# Patient Record
Sex: Female | Born: 1990 | Race: White | Hispanic: No | Marital: Single | Smoking: Current every day smoker
Health system: Southern US, Community
[De-identification: ages and names within clinical notes are randomized; demographics above are authoritative.]

## PROBLEM LIST (undated history)

## (undated) HISTORY — PX: CHOLECYSTECTOMY: SHX55

---

## 2009-07-18 ENCOUNTER — Emergency Department (HOSPITAL_COMMUNITY): Admission: EM | Admit: 2009-07-18 | Discharge: 2009-07-18 | Payer: Self-pay | Admitting: Emergency Medicine

## 2009-07-20 ENCOUNTER — Inpatient Hospital Stay (HOSPITAL_COMMUNITY): Admission: EM | Admit: 2009-07-20 | Discharge: 2009-07-21 | Payer: Self-pay | Admitting: Emergency Medicine

## 2009-07-20 ENCOUNTER — Encounter (INDEPENDENT_AMBULATORY_CARE_PROVIDER_SITE_OTHER): Payer: Self-pay

## 2009-10-25 ENCOUNTER — Encounter: Admission: RE | Admit: 2009-10-25 | Discharge: 2009-10-25 | Payer: Self-pay | Admitting: Specialist

## 2010-06-28 LAB — URINALYSIS, ROUTINE W REFLEX MICROSCOPIC
Bilirubin Urine: NEGATIVE
Glucose, UA: NEGATIVE mg/dL
Glucose, UA: NEGATIVE mg/dL
Hgb urine dipstick: NEGATIVE
Hgb urine dipstick: NEGATIVE
Ketones, ur: 40 mg/dL — AB
Ketones, ur: NEGATIVE mg/dL
Nitrite: NEGATIVE
Protein, ur: NEGATIVE mg/dL
Urobilinogen, UA: 1 mg/dL (ref 0.0–1.0)
pH: 6.5 (ref 5.0–8.0)

## 2010-06-28 LAB — COMPREHENSIVE METABOLIC PANEL
AST: 17 U/L (ref 0–37)
Alkaline Phosphatase: 54 U/L (ref 39–117)
BUN: 9 mg/dL (ref 6–23)
Calcium: 9.1 mg/dL (ref 8.4–10.5)
Chloride: 102 mEq/L (ref 96–112)
GFR calc Af Amer: >60 mL/min (ref >60)
GFR calc Af Amer: >60 mL/min (ref >60)
GFR calc non Af Amer: >60 mL/min (ref >60)
GFR calc non Af Amer: >60 mL/min (ref >60)
Glucose, Bld: 114 mg/dL — ABNORMAL HIGH (ref 70–99)
Glucose, Bld: 126 mg/dL — ABNORMAL HIGH (ref 70–99)
Sodium: 138 mEq/L (ref 135–145)
Total Bilirubin: 0.6 mg/dL (ref 0.3–1.2)
Total Bilirubin: 0.9 mg/dL (ref 0.3–1.2)
Total Protein: 6.5 g/dL (ref 6.0–8.3)

## 2010-06-28 LAB — DIFFERENTIAL
Basophils Absolute: 0 10*3/uL (ref 0.0–0.1)
Basophils Absolute: 0.1 10*3/uL (ref 0.0–0.1)
Eosinophils Absolute: 0.1 10*3/uL (ref 0.0–0.7)
Eosinophils Relative: 1 % (ref 0–5)
Lymphocytes Relative: 12 % (ref 12–46)
Lymphs Abs: 1.2 10*3/uL (ref 0.7–4.0)
Lymphs Abs: 1.4 10*3/uL (ref 0.7–4.0)
Monocytes Absolute: 0.4 10*3/uL (ref 0.1–1.0)
Monocytes Absolute: 0.5 10*3/uL (ref 0.1–1.0)
Monocytes Relative: 4 % (ref 3–12)
Neutro Abs: 7.9 10*3/uL — ABNORMAL HIGH (ref 1.7–7.7)
Neutrophils Relative %: 79 % — ABNORMAL HIGH (ref 43–77)

## 2010-06-28 LAB — CBC
HCT: 38.4 % (ref 36.0–46.0)
HCT: 41.4 % (ref 36.0–46.0)
Hemoglobin: 14.6 g/dL (ref 12.0–15.0)
MCV: 95.8 fL (ref 78.0–100.0)
Platelets: 152 10*3/uL (ref 150–400)
Platelets: 172 10*3/uL (ref 150–400)
RDW: 11.6 % (ref 11.5–15.5)
WBC: 9.1 10*3/uL (ref 4.0–10.5)
WBC: 9.6 10*3/uL (ref 4.0–10.5)

## 2010-06-28 LAB — URINE MICROSCOPIC-ADD ON

## 2010-06-28 LAB — POCT PREGNANCY, URINE: Preg Test, Ur: NEGATIVE

## 2011-05-28 IMAGING — US US ABDOMEN COMPLETE
1 series · 13 of 25 positions shown · non-contrast
Comparison: CT abdomen earlier today.

CLINICAL DATA: Abdominal pain.  Gallbladder wall thickening and
periportal edema on CT earlier.

COMPLETE ABDOMINAL ULTRASOUND 07/18/2009:

[Series 1: us abdomen complete · 0.31mm/px · 13 of 65 slices shown]
[im 1/65]
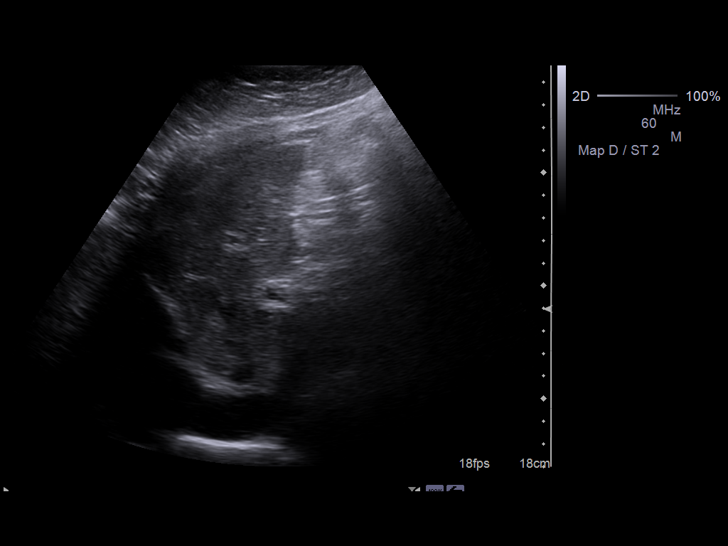
[im 6/65]
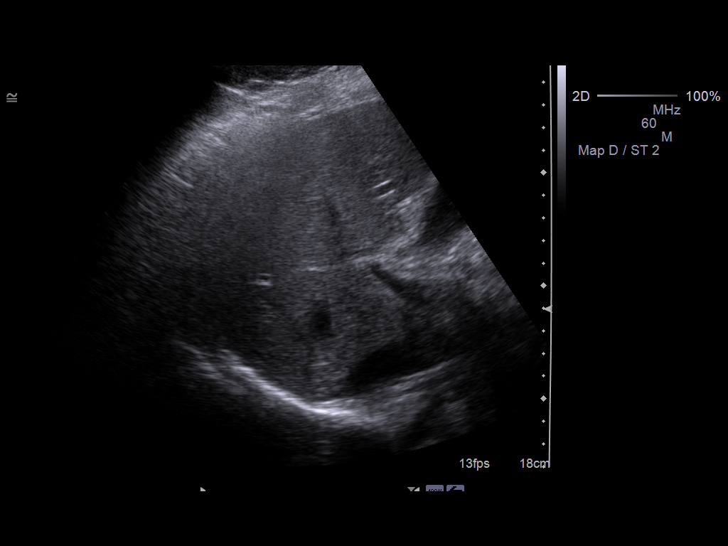
[im 11/65]
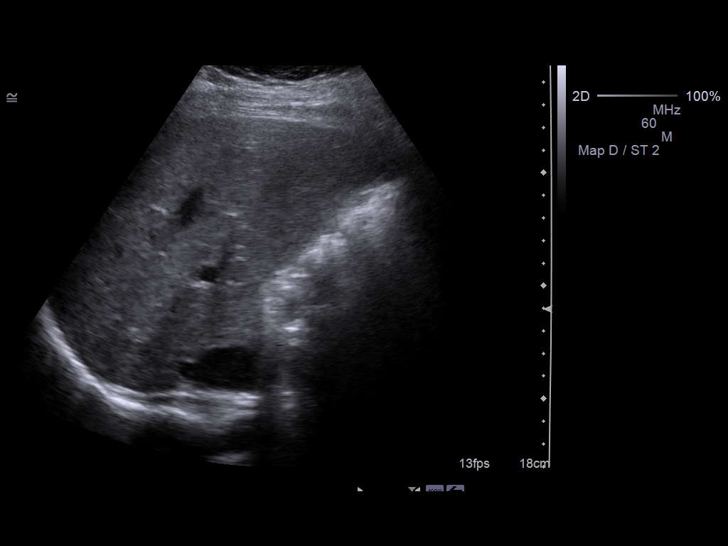
[im 17/65]
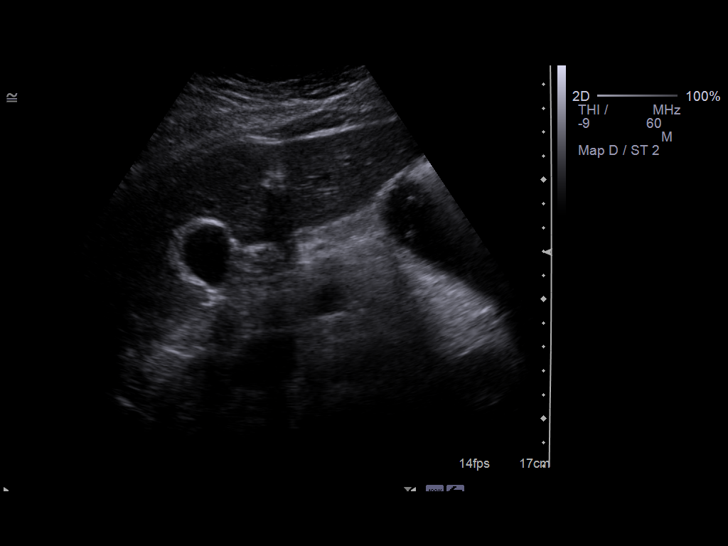
[im 22/65]
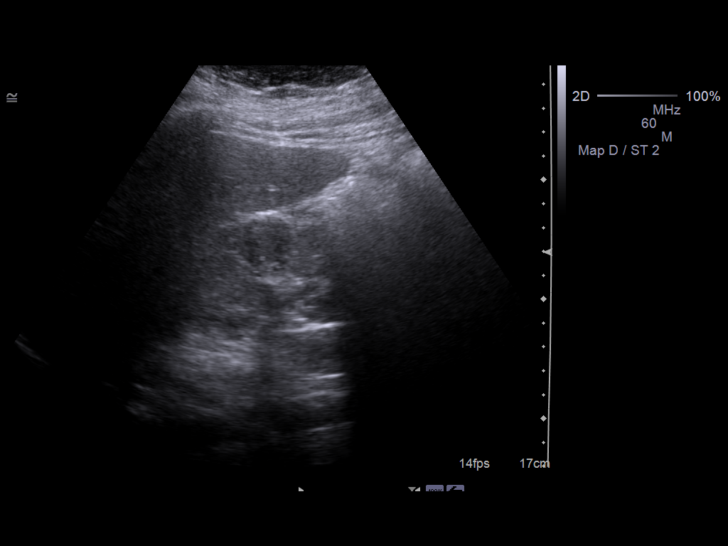
[im 27/65]
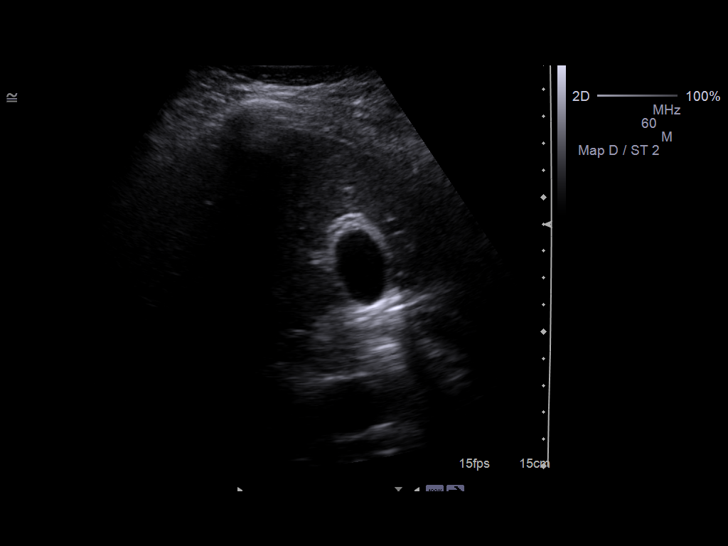
[im 33/65]
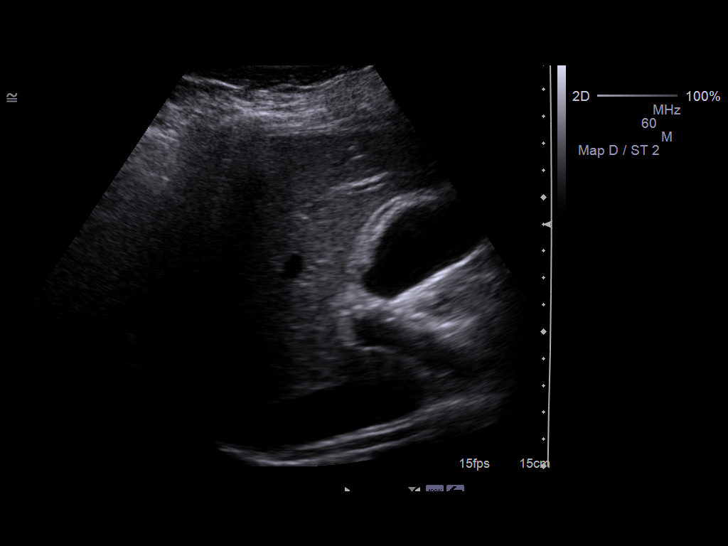
[im 38/65]
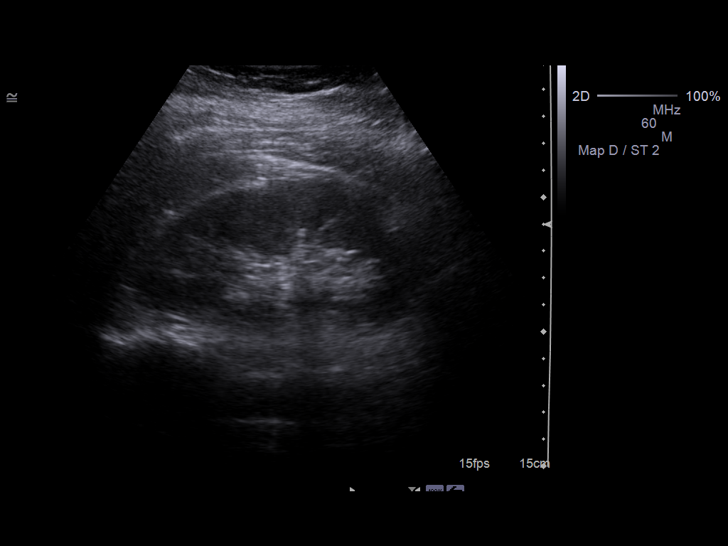
[im 43/65]
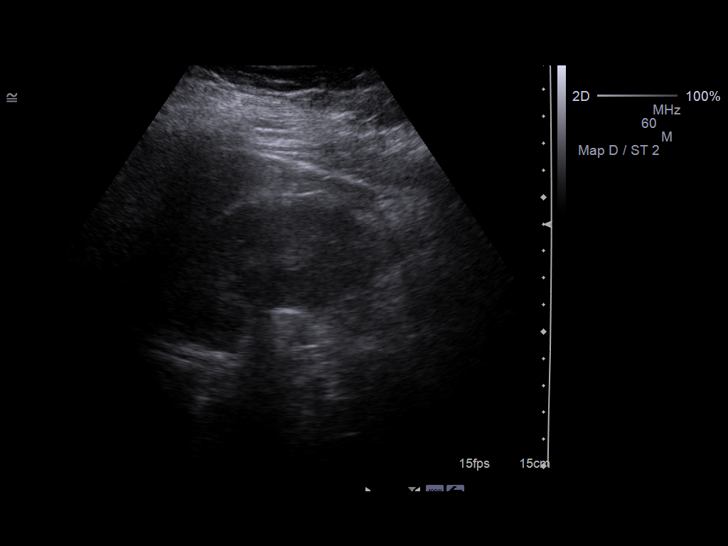
[im 49/65]
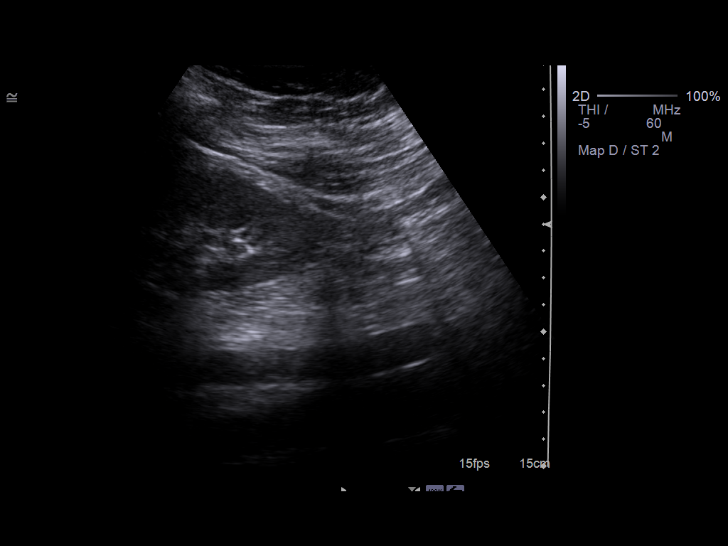
[im 54/65]
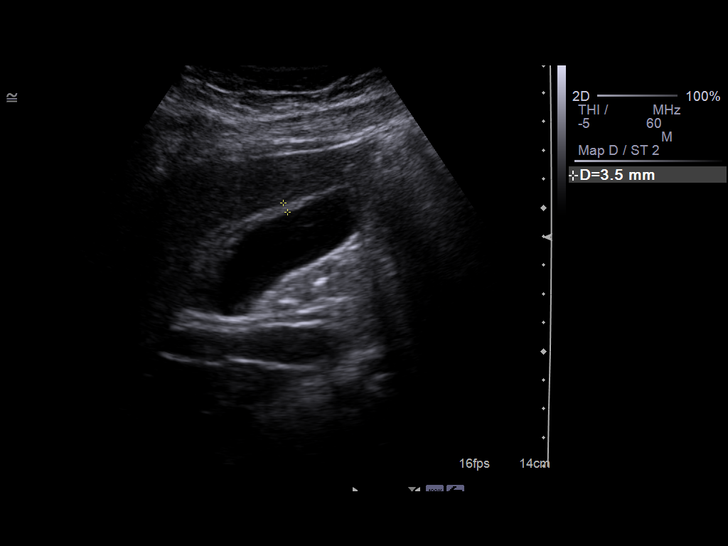
[im 59/65]
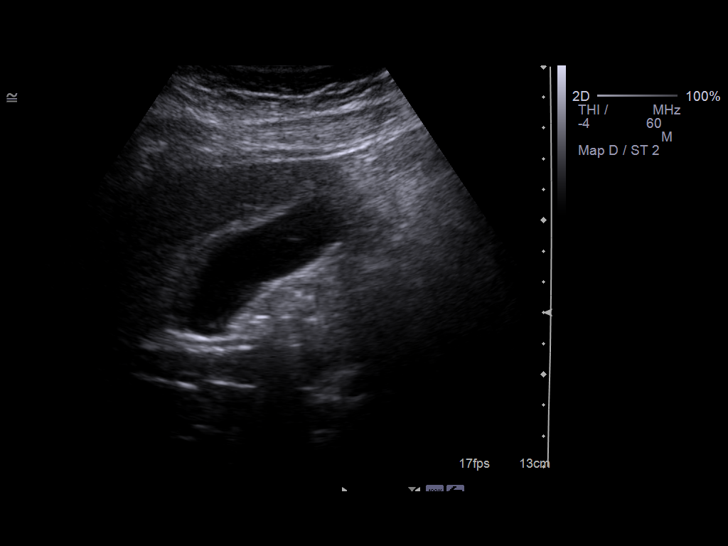
[im 65/65]
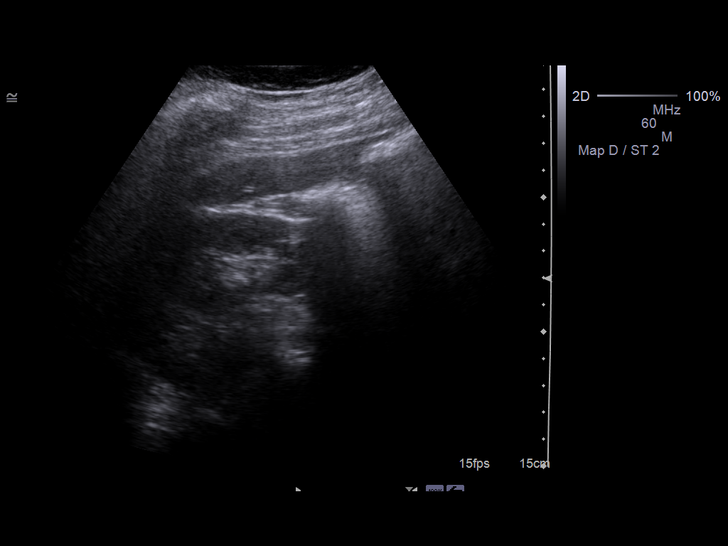

[13 of 25 positions shown; findings below may reference images not displayed]

FINDINGS: Gallbladder:  No shadowing gallstones or echogenic sludge.  Marked
gallbladder wall thickening up to approximately 7 mm.  Negative
sonographic Murphy's sign according to the ultrasound technologist.

Common bile duct:  Normal in caliber with maximum diameter
approximating 2 mm.

Liver:  Normal size and echotexture without focal parenchymal
abnormality.  Patent portal vein with hepatopetal flow. Periportal
edema is better visualized on CT.

IVC:  Patent.

Pancreas:  Although the pancreas is difficult to visualize in its
entirety, no focal pancreatic abnormality is identified.

Spleen:  Normal size and echotexture without focal parenchymal
abnormality.

Right Kidney:  No hydronephrosis.  Well-preserved cortex.  Normal
size and parenchymal echotexture without focal abnormalities.

Left Kidney:  No hydronephrosis.  Well-preserved cortex.  Normal
size and parenchymal echotexture without focal abnormalities.

Abdominal aorta:  Normal in caliber throughout its visualized
course in the abdomen.
IMPRESSION: 1.  Marked gallbladder wall thickening without cholelithiasis.
Differential diagnosis includes acalculous/chronic cholecystitis
and gallbladder wall thickening related to hepatocellular disease
such as hepatitis.  No biliary ductal dilation.
2.  Otherwise normal abdominal ultrasound.

## 2011-05-28 IMAGING — CT CT ABD-PELV W/ CM
2 of 4 series · 14 of 32 positions shown, 19 images · IV contrast (water &)
Comparison: None.

CLINICAL DATA: Abdominal pain.  Back pain.

CT ABDOMEN AND PELVIS WITH CONTRAST
TECHNIQUE: Multidetector CT imaging of the abdomen and pelvis was
performed following the standard protocol during bolus
administration of intravenous contrast.
Contrast: 100 ml Lmnipaque-VBB IV.

[Series 2: routine abdomen · axial · 0.85mm/px · z∈[-370,-34]mm · 6 of 92 slices shown, 11 images]
[im 14/92  soft-tissue]
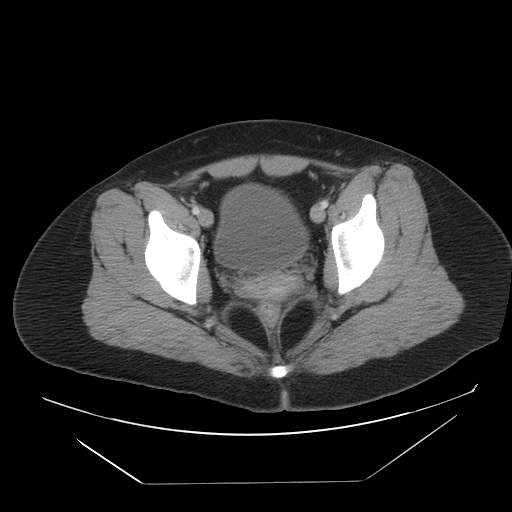
[im 14/92  bone]
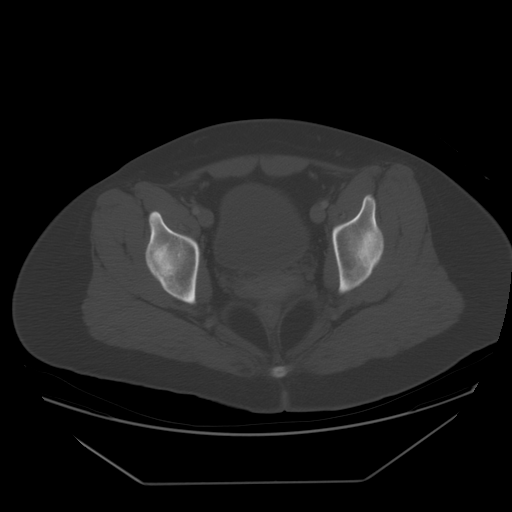
[im 27/92  soft-tissue]
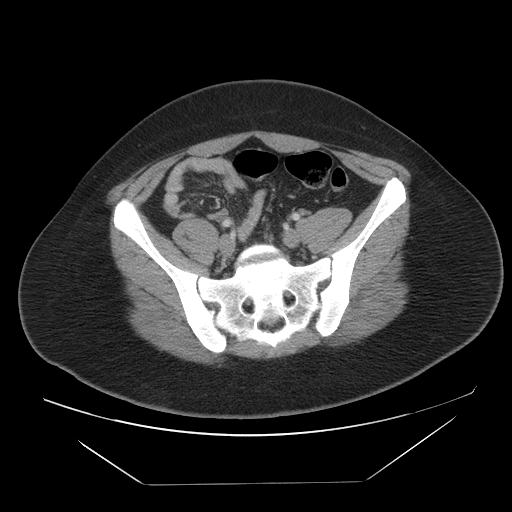
[im 40/92  soft-tissue]
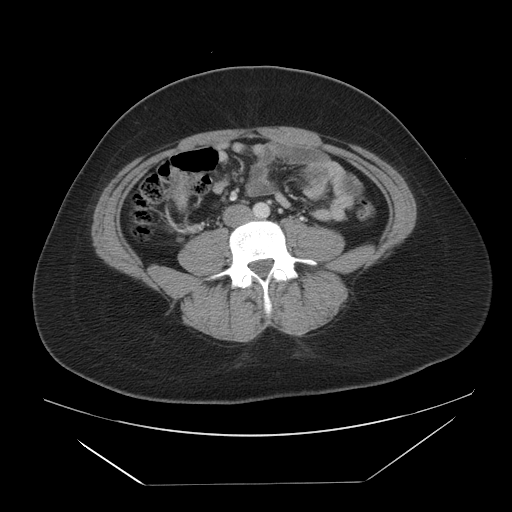
[im 40/92  lung]
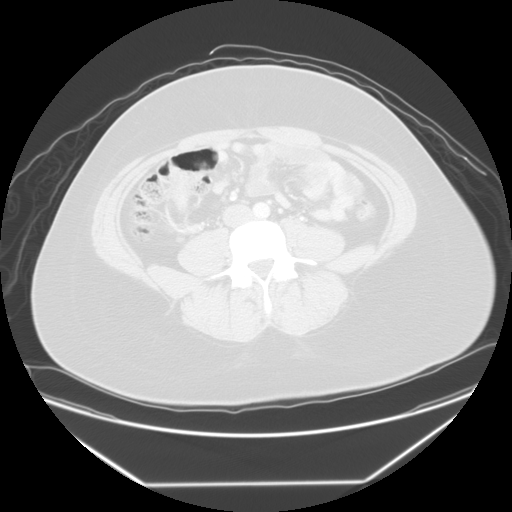
[im 53/92  soft-tissue]
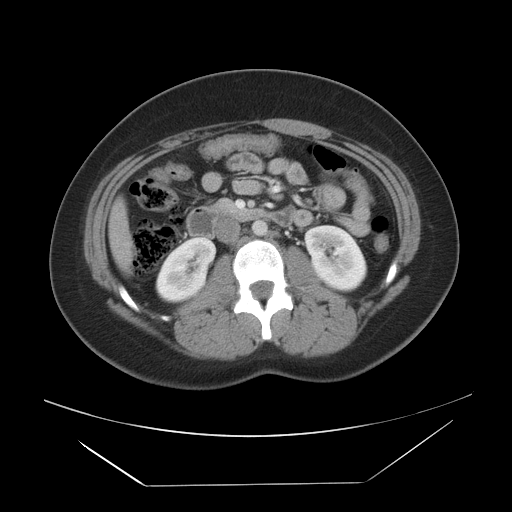
[im 53/92  lung]
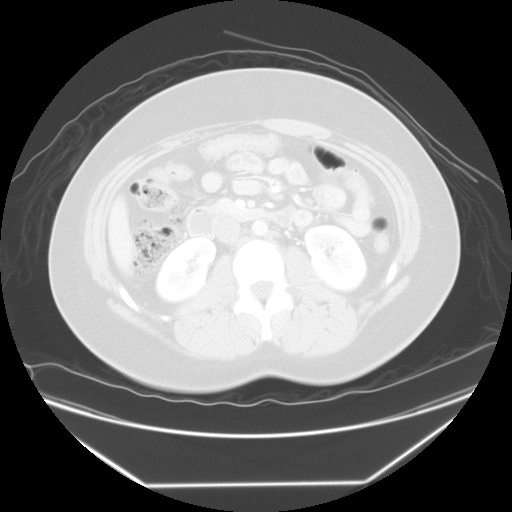
[im 66/92  soft-tissue]
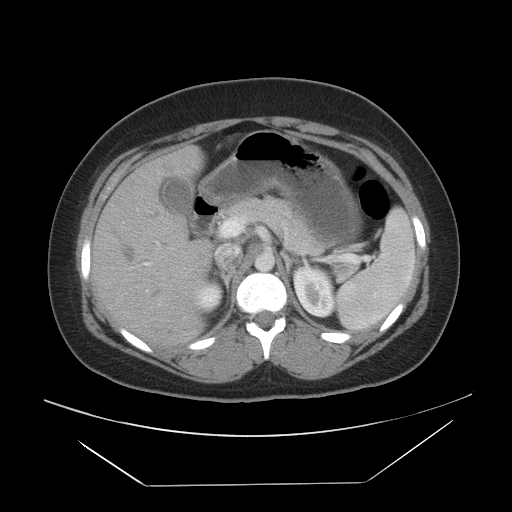
[im 66/92  lung]
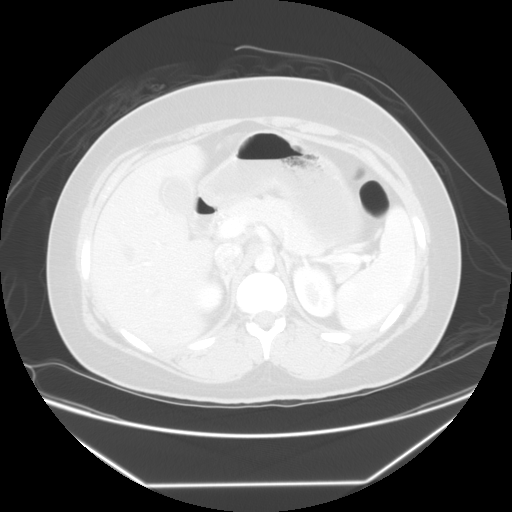
[im 79/92  soft-tissue]
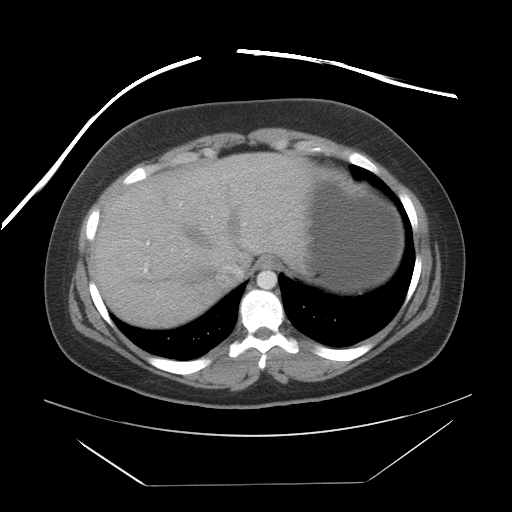
[im 79/92  lung]
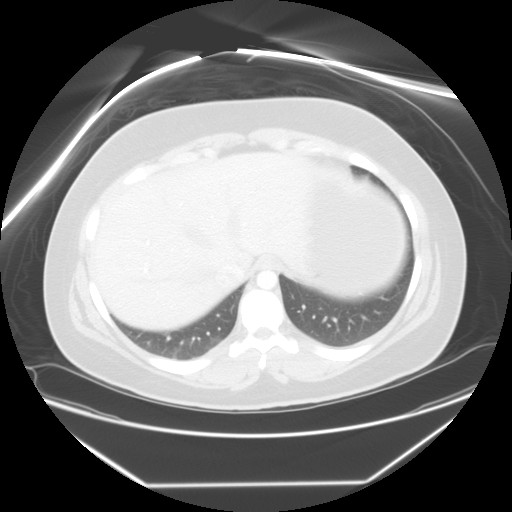

[Series 400: sag a/p · sagittal · 0.98mm/px · 8 of 120 slices shown]
[im 11/120  soft-tissue]
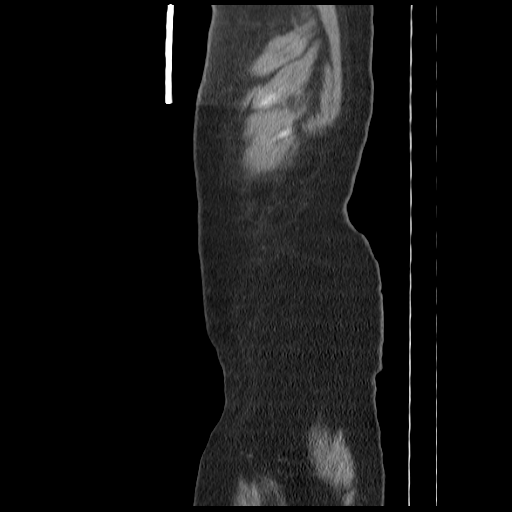
[im 22/120  soft-tissue]
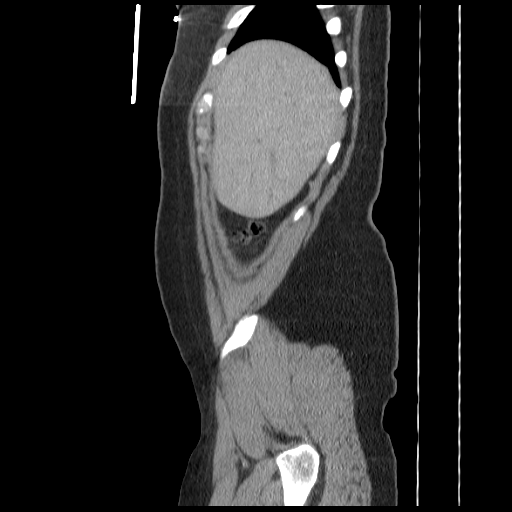
[im 44/120  soft-tissue]
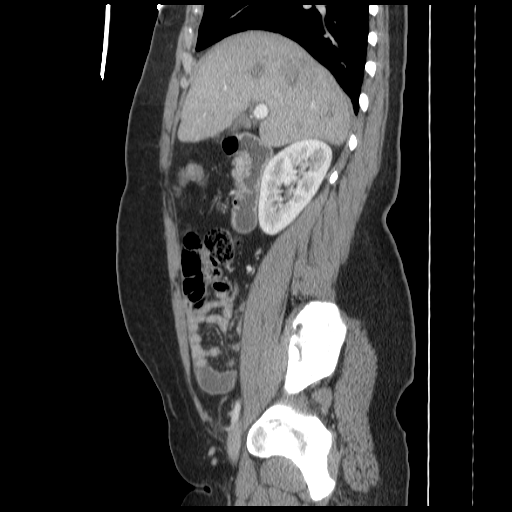
[im 55/120  soft-tissue]
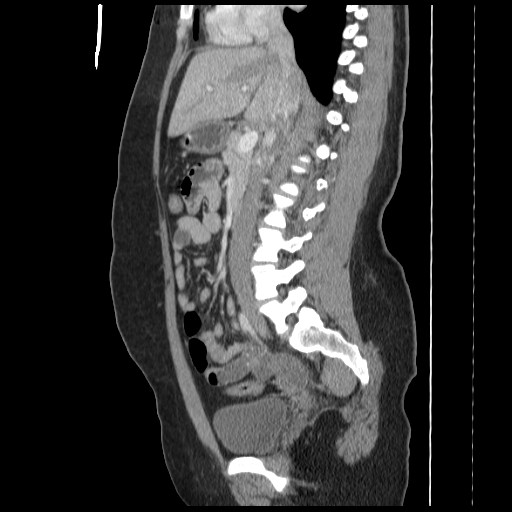
[im 65/120  soft-tissue]
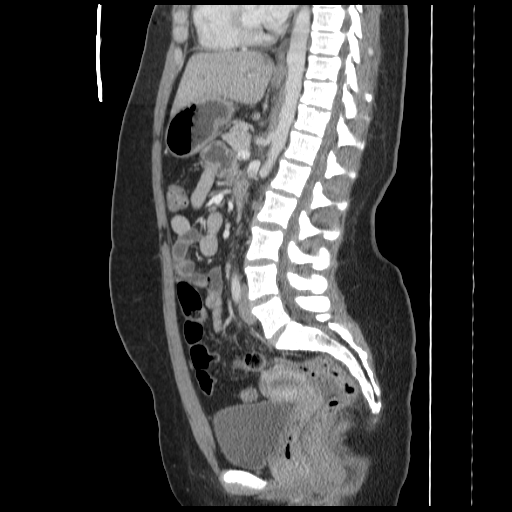
[im 76/120  soft-tissue]
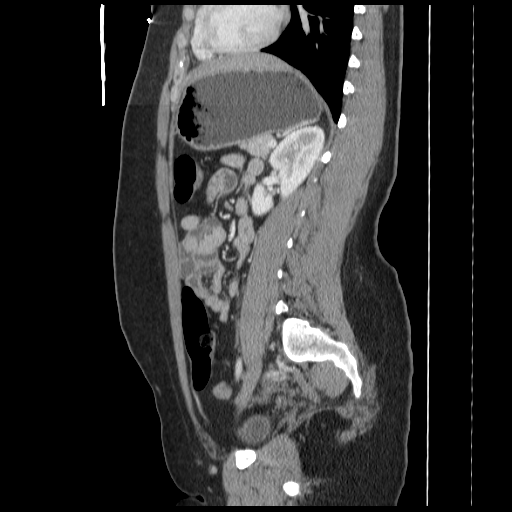
[im 98/120  soft-tissue]
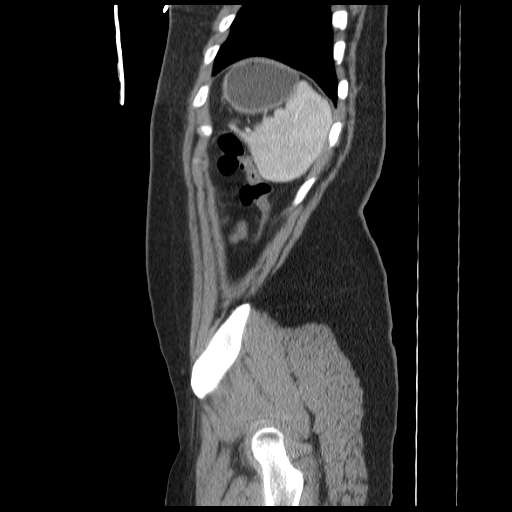
[im 109/120  soft-tissue]
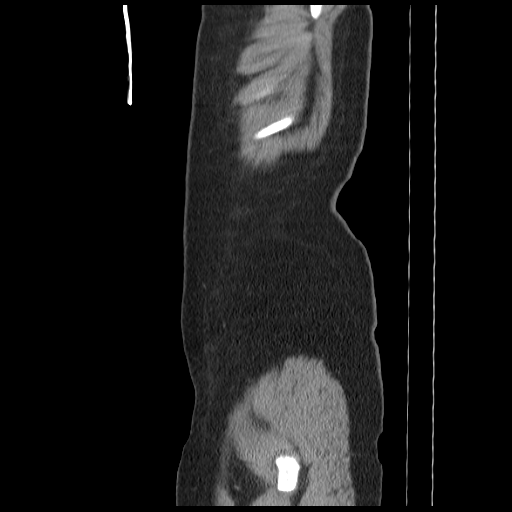

[14 of 32 positions shown; findings below may reference images not displayed]

FINDINGS: There is mild periportal edema.  This represents a
nonspecific finding.  May be seen in patients with liver
dysfunction for example hepatitis.  Normal spleen, pancreas,
kidneys, and adrenal glands.  Minimal free pericholecystic fluid.
Small collapsed left ovarian cyst. Small amount of free fluid in
the cul-de-sac.

Bony structures are unremarkable.
IMPRESSION: Mild periportal edema.  Mild free pericholecystic fluid. Consider
abdominal ultrasound.

Small amount of free fluid in the pelvis.  Small ruptured left
ovarian cyst versus corpus luteum.

## 2014-11-25 DIAGNOSIS — Z720 Tobacco use: Secondary | ICD-10-CM | POA: Diagnosis not present

## 2014-11-25 DIAGNOSIS — F419 Anxiety disorder, unspecified: Secondary | ICD-10-CM | POA: Diagnosis not present

## 2014-11-25 DIAGNOSIS — L02413 Cutaneous abscess of right upper limb: Secondary | ICD-10-CM | POA: Diagnosis present

## 2014-11-25 DIAGNOSIS — L03113 Cellulitis of right upper limb: Secondary | ICD-10-CM | POA: Insufficient documentation

## 2014-11-26 ENCOUNTER — Emergency Department (HOSPITAL_COMMUNITY)
Admission: EM | Admit: 2014-11-26 | Discharge: 2014-11-26 | Disposition: A | Discharge status: Home / Self Care | Payer: 59 | Attending: Emergency Medicine | Admitting: Emergency Medicine

## 2014-11-26 ENCOUNTER — Encounter (HOSPITAL_COMMUNITY): Payer: Self-pay | Admitting: *Deleted

## 2014-11-26 DIAGNOSIS — L0291 Cutaneous abscess, unspecified: Secondary | ICD-10-CM

## 2014-11-26 DIAGNOSIS — L03113 Cellulitis of right upper limb: Secondary | ICD-10-CM

## 2014-11-26 MED ORDER — CLINDAMYCIN HCL 150 MG PO CAPS
300.0000 mg | ORAL_CAPSULE | Freq: Once | ORAL | Status: AC
Start: 2014-11-26 — End: 2014-11-26
  Administered 2014-11-26: 300 mg via ORAL
  Filled 2014-11-26: qty 2

## 2014-11-26 MED ORDER — ONDANSETRON 4 MG PO TBDP
4.0000 mg | ORAL_TABLET | Freq: Once | ORAL | Status: AC
  Administered 2014-11-26: 4 mg via ORAL
  Filled 2014-11-26: qty 1

## 2014-11-26 MED ORDER — OXYCODONE-ACETAMINOPHEN 5-325 MG PO TABS
1.0000 | ORAL_TABLET | Freq: Once | ORAL | Status: DC
Start: 2014-11-26 — End: 2014-11-26
  Filled 2014-11-26: qty 1

## 2014-11-26 MED ORDER — IBUPROFEN 400 MG PO TABS
800.0000 mg | ORAL_TABLET | Freq: Once | ORAL | Status: AC
  Administered 2014-11-26: 800 mg via ORAL
  Filled 2014-11-26: qty 2

## 2014-11-26 MED ORDER — ONDANSETRON 4 MG PO TBDP: 4.0000 mg | ORAL_TABLET | Freq: Once | ORAL | Status: AC

## 2014-11-26 MED ORDER — TRAMADOL HCL 50 MG PO TABS: 50.0000 mg | ORAL_TABLET | Freq: Four times a day (QID) | ORAL | Status: DC | PRN

## 2014-11-26 MED ORDER — CEPHALEXIN 500 MG PO CAPS: 500.0000 mg | ORAL_CAPSULE | Freq: Four times a day (QID) | ORAL | Status: DC

## 2014-11-26 NOTE — Discharge Instructions (Signed)

## 2014-11-26 NOTE — ED Provider Notes (Signed)
CSN: [id number]     Arrival date & time 11/25/14  2340 History   First MD Initiated Contact with Patient 11/26/14 0127     Chief Complaint  Patient presents with  . Abscess     (Consider location/radiation/quality/duration/timing/severity/associated sxs/prior Treatment) HPI    PCP: FULP, CAMMIE, MD Blood pressure 124/87, pulse 107, temperature 98.5 F (36.9 C), temperature source Oral, height 5\' 6"  (1.676 m), weight 152 lb 8 oz (69.174 kg), SpO2 96 %.  Amy Mclaughlin is a 24 y.o.female with a significant PMH of cholecystectomy presents to the ER with complaints of abscess to right forearm that started two days ago. [place] the [place] and believes that she was bit by a spider but is not sure. She is very anxious. The abscess had not been hurting until she bumped it at work this afternoon and it caused severe pain. She has not had any fevers, nausea, vomiting or diarrhea. She has hx of abscesses but none this large int he past. She denies IV drug use.      The patient denies diaphoresis, fever, headache, weakness (general or focal), confusion, change of vision,  neck pain, dysphagia, aphagia, chest pain, shortness of breath,  back pain, abdominal pains, nausea, vomiting, diarrhea, lower extremity swelling.  History reviewed. No pertinent past medical history. Past Surgical History  Procedure Laterality Date  . Cholecystectomy     History reviewed. No pertinent family history. Social History  Substance Use Topics  . Smoking status: Current Every Day Smoker    Types: Cigarettes  . Smokeless tobacco: Never Used  . Alcohol Use: No   OB History    No data available     Review of Systems  10 Systems reviewed and are negative for acute change except as noted in the HPI.     Allergies  Sulfa antibiotics  Home Medications   Prior to Admission medications   Medication Sig Start Date End Date Taking? Authorizing Provider  cephALEXin (KEFLEX) 500 MG capsule Take 1 capsule  (500 mg total) by mouth 4 (four) times daily. 11/26/14   Nakesha Ebrahim Neva Seat, PA-C   BP 124/87 mmHg  Pulse 107  Temp(Src) 98.5 F (36.9 C) (Oral)  Ht 5\' 6"  (1.676 m)  Wt 152 lb 8 oz (69.174 kg)  BMI 24.63 kg/m2  SpO2 96% Physical Exam  Constitutional: She appears well-developed and well-nourished. No distress.  Pt is anxious but does not look ill or sick  HENT:  Head: Normocephalic and atraumatic.  Eyes: Pupils are equal, round, and reactive to light.  Neck: Normal range of motion. Neck supple.  Cardiovascular: Normal rate and regular rhythm.   Pulmonary/Chest: Effort normal.  Abdominal: Soft.  Musculoskeletal:       Right forearm: She exhibits tenderness, swelling and edema. She exhibits no bony tenderness, no deformity and no laceration.  Large golf ball sized abscess to right posterior forearm. Associated cellulitis. No opening or drainage. NO bleeding. Tenderness to palpation. NO sign of track marks or insect bites.   Neurological: She is alert.  Skin: Skin is warm and dry.  Nursing note and vitals reviewed.   ED Course  Procedures (including critical care time) Labs Review Labs Reviewed - No data to display  Imaging Review No results found. I have personally reviewed and evaluated these images and lab results as part of my medical decision-making.   EKG Interpretation None      MDM   Final diagnoses:  Abscess  Cellulitis of right upper extremity  INCISION AND DRAINAGE Performed by: Dorthula Matas Consent: Verbal consent obtained. Risks and benefits: risks, benefits and alternatives were discussed Type: abscess  Body area: right forearm  Anesthesia: local infiltration  Incision was made with a scalpel.  Local anesthetic: lidocaine 1 % wo epinephrine  Anesthetic total: 2 ml  Complexity: complex Blunt dissection to break up loculations  Drainage: purulent  Drainage amount: large  Packing material: 1/4 in iodoform gauze packing material, approx 2  inches   Patient tolerance: Patient tolerated the procedure well with no immediate complications.   I suspect MRSA is the cause of patients infection but she is allergic to Sulfa. She says that it will be 12 days before she could afford Clindamycin. Will rx Keflex and have her return in 2 days for packing removal. If cellulitis is not improved she will need to be changed to Clindamycin -- have case manager or social work assist if needed.  Wound culture obtained  Rx: for # 6 Ultram and Keflex given.  Medications  oxyCODONE-acetaminophen (PERCOCET/ROXICET) 5-325 MG per tablet 1 tablet (1 tablet Oral Not Given 11/26/14 0200)  clindamycin (CLEOCIN) capsule 300 mg (not administered)  ondansetron (ZOFRAN-ODT) disintegrating tablet 4 mg (not administered)  ondansetron (ZOFRAN-ODT) disintegrating tablet 4 mg (4 mg Oral Given 11/26/14 0136)  ibuprofen (ADVIL,MOTRIN) tablet 800 mg (800 mg Oral Given 11/26/14 0135)    24 y.o.Amy Mclaughlin's evaluation in the Emergency Department is complete. It has been determined that no acute conditions requiring further emergency intervention are present at this time. The patient/guardian have been advised of the diagnosis and plan. We have discussed signs and symptoms that warrant return to the ED, such as changes or worsening in symptoms.  Vital signs are stable at discharge. Filed Vitals:   11/26/14 0105  BP:   Pulse: 107  Temp:     Patient/guardian has voiced understanding and agreed to follow-up with the PCP or specialist.    Marlon Pel, PA-C 11/26/14 0205  Azalia Bilis, MD 11/26/14 [phone]

## 2014-11-26 NOTE — ED Notes (Signed)
Pt presents with a golf ball size abscess on her right forearm.

## 2014-11-28 ENCOUNTER — Emergency Department (HOSPITAL_COMMUNITY)
Admission: EM | Admit: 2014-11-28 | Discharge: 2014-11-29 | Disposition: A | Discharge status: Home / Self Care | Payer: 59 | Attending: Emergency Medicine | Admitting: Emergency Medicine

## 2014-11-28 ENCOUNTER — Encounter (HOSPITAL_COMMUNITY): Payer: Self-pay | Admitting: *Deleted

## 2014-11-28 DIAGNOSIS — Z5189 Encounter for other specified aftercare: Secondary | ICD-10-CM

## 2014-11-28 DIAGNOSIS — Z4801 Encounter for change or removal of surgical wound dressing: Secondary | ICD-10-CM | POA: Diagnosis present

## 2014-11-28 DIAGNOSIS — R000 Tachycardia, unspecified: Secondary | ICD-10-CM | POA: Insufficient documentation

## 2014-11-28 DIAGNOSIS — Z720 Tobacco use: Secondary | ICD-10-CM | POA: Diagnosis not present

## 2014-11-28 DIAGNOSIS — L03113 Cellulitis of right upper limb: Secondary | ICD-10-CM | POA: Diagnosis not present

## 2014-11-28 LAB — WOUND CULTURE: Culture: NO GROWTH

## 2014-11-28 NOTE — ED Notes (Signed)
Pt states that she was seen here 2 days ago for an abscess to rt arm. States that the abscess was drained and packed and told to follow up today to have packing removed. States that she has been taking her antibiotics as prescribed.

## 2014-11-28 NOTE — ED Notes (Signed)
Pt states that she also has the money to get the stronger antibiotics that the PA suggested the first time.

## 2014-11-29 MED ORDER — DOXYCYCLINE HYCLATE 100 MG PO CAPS: 100.0000 mg | ORAL_CAPSULE | Freq: Two times a day (BID) | ORAL | Status: DC

## 2014-11-29 NOTE — ED Notes (Signed)
Pt stable, ambulatory, states understanding of discharge instructions 

## 2014-11-29 NOTE — Discharge Instructions (Signed)

## 2014-11-29 NOTE — ED Provider Notes (Signed)
CSN: [id number]     Arrival date & time 11/28/14  2306 History   First MD Initiated Contact with Patient 11/28/14 2350     Chief Complaint  Patient presents with  . Abscess     (Consider location/radiation/quality/duration/timing/severity/associated sxs/prior Treatment) HPI Comments: Patient presents to the emergency department with chief complaint of wound check. Patient had an abscess drained on her right arm 2 days ago. She states that the symptoms have improved. She has been taking her antibiotic's with good relief.  She reports that she initially had some oozing and swelling, but this has improved.  She states that her pain is improved.  She is here to have packing removed.    The history is provided by the patient. No language interpreter was used.    History reviewed. No pertinent past medical history. Past Surgical History  Procedure Laterality Date  . Cholecystectomy     No family history on file. Social History  Substance Use Topics  . Smoking status: Current Every Day Smoker    Types: Cigarettes  . Smokeless tobacco: Never Used  . Alcohol Use: No   OB History    No data available     Review of Systems  Constitutional: Negative for fever and chills.  Respiratory: Negative for shortness of breath.   Cardiovascular: Negative for chest pain.  Gastrointestinal: Negative for nausea, vomiting, diarrhea and constipation.  Genitourinary: Negative for dysuria.  Skin: Positive for wound.      Allergies  Sulfa antibiotics  Home Medications   Prior to Admission medications   Medication Sig Start Date End Date Taking? Authorizing Provider  doxycycline (VIBRAMYCIN) 100 MG capsule Take 1 capsule (100 mg total) by mouth 2 (two) times daily. 11/29/14   Roxy Horseman, PA-C  traMADol (ULTRAM) 50 MG tablet Take 1 tablet (50 mg total) by mouth every 6 (six) hours as needed. 11/26/14   Tiffany Neva Seat, PA-C   BP 132/78 mmHg  Pulse 113  Resp 18  Wt 153 lb (69.4 kg)  SpO2  99% Physical Exam  Constitutional: She is oriented to person, place, and time. She appears well-developed and well-nourished.  HENT:  Head: Normocephalic and atraumatic.  Eyes: Conjunctivae and EOM are normal.  Neck: Normal range of motion.  Cardiovascular:  Mildly tachycardic  Pulmonary/Chest: Effort normal.  Abdominal: She exhibits no distension.  Musculoskeletal: Normal range of motion.  Neurological: She is alert and oriented to person, place, and time.  Skin: Skin is dry.  Patient with moderate persistent cellulitis surrounding prior abscess site on right forearm, no discharge or drainage, no streaking  Psychiatric: She has a normal mood and affect. Her behavior is normal. Judgment and thought content normal.  Nursing note and vitals reviewed.   ED Course  Procedures (including critical care time) Labs Review Labs Reviewed - No data to display  Imaging Review No results found. I have personally reviewed and evaluated these images and lab results as part of my medical decision-making.   EKG Interpretation None      MDM   Final diagnoses:  Wound check, abscess    Patient here for wound check, packing material removed, there is mild surrounding erythema consistent with cellulitis, will switch to doxycycline, and instructed patient to follow-up with her primary care doctor. Patient understands and agrees to plan. She is stable and ready for discharge.    Roxy Horseman, PA-C 11/29/14 6213  Tomasita Crumble, MD 11/29/14 [phone]

## 2015-03-29 ENCOUNTER — Encounter: Payer: Self-pay | Admitting: Nurse Practitioner

## 2015-03-29 ENCOUNTER — Encounter (HOSPITAL_COMMUNITY): Payer: Self-pay | Admitting: *Deleted

## 2015-03-29 ENCOUNTER — Ambulatory Visit (INDEPENDENT_AMBULATORY_CARE_PROVIDER_SITE_OTHER): Payer: 59 | Admitting: Nurse Practitioner

## 2015-03-29 ENCOUNTER — Emergency Department (HOSPITAL_COMMUNITY)
Admission: EM | Admit: 2015-03-29 | Discharge: 2015-03-29 | Disposition: A | Discharge status: Home / Self Care | Payer: 59 | Attending: Emergency Medicine | Admitting: Emergency Medicine

## 2015-03-29 VITALS — BP 114/70 | HR 87 | Temp 97.8°F | Ht 65.08 in | Wt 146.0 lb

## 2015-03-29 DIAGNOSIS — Z046 Encounter for general psychiatric examination, requested by authority: Secondary | ICD-10-CM | POA: Diagnosis present

## 2015-03-29 DIAGNOSIS — E785 Hyperlipidemia, unspecified: Secondary | ICD-10-CM | POA: Diagnosis not present

## 2015-03-29 DIAGNOSIS — F419 Anxiety disorder, unspecified: Principal | ICD-10-CM

## 2015-03-29 DIAGNOSIS — F17210 Nicotine dependence, cigarettes, uncomplicated: Secondary | ICD-10-CM | POA: Insufficient documentation

## 2015-03-29 DIAGNOSIS — F1510 Other stimulant abuse, uncomplicated: Secondary | ICD-10-CM | POA: Insufficient documentation

## 2015-03-29 DIAGNOSIS — F32A Depression, unspecified: Secondary | ICD-10-CM | POA: Insufficient documentation

## 2015-03-29 DIAGNOSIS — F418 Other specified anxiety disorders: Secondary | ICD-10-CM | POA: Diagnosis not present

## 2015-03-29 DIAGNOSIS — Z3202 Encounter for pregnancy test, result negative: Secondary | ICD-10-CM | POA: Insufficient documentation

## 2015-03-29 DIAGNOSIS — F329 Major depressive disorder, single episode, unspecified: Secondary | ICD-10-CM | POA: Insufficient documentation

## 2015-03-29 LAB — RAPID URINE DRUG SCREEN, HOSP PERFORMED
Amphetamines: POSITIVE — AB
BENZODIAZEPINES: NOT DETECTED
Barbiturates: NOT DETECTED
COCAINE: NOT DETECTED
OPIATES: NOT DETECTED
Tetrahydrocannabinol: NOT DETECTED

## 2015-03-29 LAB — COMPREHENSIVE METABOLIC PANEL
ALBUMIN: 4.4 g/dL (ref 3.5–5.0)
ALK PHOS: 94 U/L (ref 38–126)
ALT: 57 U/L — ABNORMAL HIGH (ref 14–54)
ANION GAP: 7 (ref 5–15)
AST: 34 U/L (ref 15–41)
BUN: 19 mg/dL (ref 6–20)
CALCIUM: 9.7 mg/dL (ref 8.9–10.3)
CO2: 30 mmol/L (ref 22–32)
Chloride: 104 mmol/L (ref 101–111)
Creatinine, Ser: 0.61 mg/dL (ref 0.44–1.00)
GFR calc non Af Amer: >60 mL/min (ref >60)
GLUCOSE: 94 mg/dL (ref 65–99)
POTASSIUM: 4.6 mmol/L (ref 3.5–5.1)
Sodium: 141 mmol/L (ref 135–145)
TOTAL PROTEIN: 8.1 g/dL (ref 6.5–8.1)
Total Bilirubin: 0.5 mg/dL (ref 0.3–1.2)

## 2015-03-29 LAB — CBC
HCT: 43.3 % (ref 36.0–46.0)
Hemoglobin: 14.4 g/dL (ref 12.0–15.0)
MCH: 31.4 pg (ref 26.0–34.0)
MCHC: 33.3 g/dL (ref 30.0–36.0)
MCV: 94.3 fL (ref 78.0–100.0)
Platelets: 212 10*3/uL (ref 150–400)
RBC: 4.59 MIL/uL (ref 3.87–5.11)
RDW: 13 % (ref 11.5–15.5)
WBC: 6.3 10*3/uL (ref 4.0–10.5)

## 2015-03-29 LAB — ACETAMINOPHEN LEVEL

## 2015-03-29 LAB — POC URINE PREG, ED: PREG TEST UR: NEGATIVE

## 2015-03-29 LAB — ETHANOL: Alcohol, Ethyl (B): <5 mg/dL (ref <5)

## 2015-03-29 LAB — SALICYLATE LEVEL

## 2015-03-29 NOTE — Patient Instructions (Signed)
Will get lab work today  Follow up in 4-6 weeks for physical

## 2015-03-29 NOTE — ED Notes (Signed)
IVC paperwork will be resended. Pt asking rn to call her mother and inform her and ask if mother will come pick her up. rn called pts mother in room with pt. Mother said that "she couldn't believe this" and that the pt was "faking it". rn explained that the pt has been evaluated and IVC papers will be resended. Mother reports she is not going to come pick up pt, and pt is not welcome in her house.

## 2015-03-29 NOTE — ED Provider Notes (Signed)
CSN: [id number]     Arrival date & time 03/29/15  1034 History   First MD Initiated Contact with Patient 03/29/15 1403     Chief Complaint  Patient presents with  . IVC      (Consider location/radiation/quality/duration/timing/severity/associated sxs/prior Treatment) The history is provided by the patient.  Amy Mclaughlin is a 24 y.o. female otherwise healthy here presenting with possible aggressive behavior. Patient moved in with her mother about 3 days ago. She states that they don't usually get along. They had an argument yesterday. States that she was never aggressive towards her mother. Her mother wants her to see Dr. at [facility]. She did go there and saw a provider. While she was there, her mother called the cops and filled out IVC paperwork on her. She was evaluated there and was thought not be a danger to herself or others. She was offered medications but refused. Denies hallucinations. She did use marijuana several days ago that "may be laced with something". Denies alcohol use. Denies thoughts to harm her mother.   History reviewed. No pertinent past medical history. Past Surgical History  Procedure Laterality Date  . Cholecystectomy     History reviewed. No pertinent family history. Social History  Substance Use Topics  . Smoking status: Current Every Day Smoker -- 0.50 packs/day for 10 years    Types: Cigarettes  . Smokeless tobacco: Never Used  . Alcohol Use: No   OB History    No data available     Review of Systems  Psychiatric/Behavioral: Positive for dysphoric mood.  All other systems reviewed and are negative.     Allergies  Codeine and Sulfa antibiotics  Home Medications   Prior to Admission medications   Medication Sig Start Date End Date Taking? Authorizing Provider  aspirin-acetaminophen-caffeine (EXCEDRIN MIGRAINE) [id number] MG tablet Take 1 tablet by mouth every 6 (six) hours as needed for headache.   Yes Historical Provider, MD    BP 117/73 mmHg  Pulse 79  Temp(Src) 98 F (36.7 C) (Oral)  Resp 17  SpO2 100%  LMP 03/26/2015 (Exact Date) Physical Exam  Constitutional: She is oriented to person, place, and time. She appears well-developed and well-nourished.  Calm, NAD   HENT:  Head: Normocephalic.  Eyes: Conjunctivae are normal. Pupils are equal, round, and reactive to light.  Neck: Normal range of motion.  Cardiovascular: Normal rate, regular rhythm and normal heart sounds.   Pulmonary/Chest: Effort normal and breath sounds normal. No respiratory distress. She has no wheezes. She has no rales.  Abdominal: Soft. Bowel sounds are normal. She exhibits no distension. There is no tenderness. There is no rebound.  Musculoskeletal: Normal range of motion. She exhibits no edema or tenderness.  Neurological: She is alert and oriented to person, place, and time.  Skin: Skin is warm and dry.  Psychiatric:  Not suicidal, appropriate affect   Nursing note and vitals reviewed.   ED Course  Procedures (including critical care time) Labs Review Labs Reviewed  COMPREHENSIVE METABOLIC PANEL - Abnormal; Notable for the following:    ALT 57 (*)    All other components within normal limits  ACETAMINOPHEN LEVEL - Abnormal; Notable for the following:    Acetaminophen (Tylenol), Serum <10 (*)    All other components within normal limits  URINE RAPID DRUG SCREEN, HOSP PERFORMED - Abnormal; Notable for the following:    Amphetamines POSITIVE (*)    All other components within normal limits  ETHANOL  SALICYLATE LEVEL  CBC  POC URINE PREG, ED    Imaging Review No results found. I have personally reviewed and evaluated these images and lab results as part of my medical decision-making.   EKG Interpretation None      MDM   Final diagnoses:  None   Amy Mclaughlin is a 24 y.o. female here with depression. Patient not suicidal or homicidal and has no hallucinations. UDS + amphetamines but patient has stable  vitals. Patient states that she will not hurt her mother. I reviewed IVC paperwork but patient doesn't seem to require it. Will rescind IVC paperwork. Stable for dc.      Richardean Canalavid H Noris Kulinski, MD 03/29/15 [phone]

## 2015-03-29 NOTE — Discharge Instructions (Signed)
See your primary care doctor regarding follow up for depression and anxiety.   Avoid using drugs and alcohol.   Return to ER if you have thoughts of harming yourself or others, hallucinations.

## 2015-03-29 NOTE — ED Notes (Addendum)
Per [employer] pt was living on her own until 3 days when pt got evicted, pt has since been living with mother, and they are not getting along. This morning pt was at [facility], mom and pt got in argument, then mom took out IVC paperwork.   IVC paperwork states " pt is a danger to self and others, pt has become hostile and aggressive, sleeps all day, petitioner found a mirror with white powder and razor blade in pts room and syringe in her purse. Threatens to 'do something bad' when she doesn't get her way, petitioner has become afraid of pt. Friend of respondent died of heroin overdose in October, which seems to be the beginning of this irrational behavior".   Providers note from [facility] states " Planning to move to [place] tonight to get out of her mothers house. In October watched her best friend died of OD. She was just trying to help and let him stay at her house. Reports now investigation being done. Would not like to be on medication at this time. Denies SI or HI, no past attempts in hurting herself."   Upon rn assessment, pt did not want to go to visit at [facility], pt went in and had appt, but while she was in their her mom took out IVC paperwork. Pt denies SI/HI, AH/VH. Mom wants pt to go on medications. Pt does not want to go on medications and [facility] not think [facility] needed to go on medication. Pt denies pain. Denies ETOH use. Reports cigarette and marijuana use.

## 2015-03-29 NOTE — ED Notes (Signed)
Pt waiting for a ride, was given phone charger to charge phone while she waits.

## 2015-03-29 NOTE — Progress Notes (Signed)
Patient ID: Amy Mclaughlin, female   DOB: Nov 16, 1990, 24 y.o.   MRN: [id number]    PCP: Cain Saupe, MD   Allergies  Allergen Reactions  . Sulfa Antibiotics Hives    Chief Complaint  Patient presents with  . Establish Care    New patient establish care, depression. Patient took Zoloft and stopped months ago, patient's mother thinks she should really be medicated   . Referral    GYN referral- Female     HPI: Patient is a 24 y.o. female seen in the office today to establish care. No recent blood work. Was told she had a hx of high cholesterol but in the past has been normal.  Pt here today because her mother made her come.mother also called the police to make sure she came to the appt.  Mother feels like she is crazy but she does not get along well with her mother. She just moved in with her because she got evicted from her place. Does have anxiety and depression but does notfeel like she needs to be here. Feels like counseling/physiologist  would be more benefital.  Planning to move to [place] tonight to get out of her mothers house. In Feb 19, 2023 watched her best friend died of OD. She was just trying to help and let him stay at her house. Reports now investigation being done.  Would not like to be on medication at this time.  Denies SI or HI, no past attempts in hurting herself   Review of Systems:  Review of Systems  Constitutional: Positive for fatigue. Negative for activity change, appetite change and unexpected weight change.  HENT: Negative for congestion and hearing loss.   Eyes: Negative.   Respiratory: Negative for cough and shortness of breath.   Cardiovascular: Negative for chest pain, palpitations and leg swelling.  Gastrointestinal: Negative for abdominal pain, diarrhea and constipation.  Genitourinary: Negative for dysuria and difficulty urinating.  Musculoskeletal: Negative for myalgias and arthralgias.  Skin: Negative for color change and wound.    Neurological: Negative for dizziness and weakness.  Psychiatric/Behavioral: Positive for sleep disturbance. Negative for suicidal ideas, behavioral problems, confusion, self-injury and agitation. The patient is nervous/anxious.     History reviewed. No pertinent past medical history. Past Surgical History  Procedure Laterality Date  . Cholecystectomy     Social History:   reports that she has been smoking Cigarettes.  She has been smoking about 0.50 packs per day. She has never used smokeless tobacco. She reports that she uses illicit drugs (Marijuana). She reports that she does not drink alcohol.  History reviewed. No pertinent family history.  Medications: Patient's Medications  New Prescriptions   No medications on file  Previous Medications   No medications on file  Modified Medications   No medications on file  Discontinued Medications   DOXYCYCLINE (VIBRAMYCIN) 100 MG CAPSULE    Take 1 capsule (100 mg total) by mouth 2 (two) times daily.   TRAMADOL (ULTRAM) 50 MG TABLET    Take 1 tablet (50 mg total) by mouth every 6 (six) hours as needed.     Physical Exam:  Filed Vitals:   03/29/15 0849  BP: 114/70  Pulse: 87  Temp: 97.8 F (36.6 C)  TempSrc: Oral  Height: 5' 5.08" (1.653 m)  Weight: 146 lb (66.225 kg)  SpO2: 97%   Body mass index is 24.24 kg/(m^2).  Physical Exam  Constitutional: She is oriented to person, place, and time. She appears well-developed and well-nourished. No  distress.  HENT:  Head: Normocephalic and atraumatic.  Eyes: Conjunctivae are normal. Pupils are equal, round, and reactive to light.  Neck: Normal range of motion. Neck supple.  Cardiovascular: Normal rate, regular rhythm and normal heart sounds.   Pulmonary/Chest: Effort normal and breath sounds normal.  Abdominal: Soft. Bowel sounds are normal.  Musculoskeletal: She exhibits no edema.  Neurological: She is alert and oriented to person, place, and time.  Skin: Skin is warm and dry.  She is not diaphoretic.  Psychiatric: She has a normal mood and affect.   Labs reviewed: Basic Metabolic Panel: No results for input(s): NA, K, CL, CO2, GLUCOSE, BUN, CREATININE, CALCIUM, MG, PHOS, TSH in the last 8760 hours. Liver Function Tests: No results for input(s): AST, ALT, ALKPHOS, BILITOT, PROT, ALBUMIN in the last 8760 hours. No results for input(s): LIPASE, AMYLASE in the last 8760 hours. No results for input(s): AMMONIA in the last 8760 hours. CBC: No results for input(s): WBC, NEUTROABS, HGB, HCT, MCV, PLT in the last 8760 hours. Lipid Panel: No results for input(s): CHOL, HDL, LDLCALC, TRIG, CHOLHDL, LDLDIRECT in the last 8760 hours. TSH: No results for input(s): TSH in the last 8760 hours. A1C: No results found for: HGBA1C   Assessment/Plan 1. Anxiety and depression -most of the time spent discussing anxiety and depression. Has had a hx of this but with recent social events it is worse.  Pt does not wish to be on medication at this time. Willing to go to counseling. Information provided for psychologist and psychiatrist. Discussed if she feeling SI or HI to seek immediate medical attention.  - CBC with Differential - TSH  2. Hyperlipidemia Reports history of hyperlipidemia in her teens, more recent test normal - Comprehensive metabolic panel - Lipid panel (not fasting)  3. Diarrhea After gallbladder was removed. Diet modifications discussed. May use Probiotic and increase fiber  35 mins Time total :  time greater than 50% of total time spent doing pt counseled and coordination of care regarding plan of care for anxiety and depression    Maylon Sailors K. Biagio BorgEubanks, AGNP  [facility] [phone](Monday-Friday 8 am - 5 pm) [phone] (after hours)

## 2015-03-29 NOTE — ED Notes (Signed)
md at bedside

## 2015-03-30 LAB — COMPREHENSIVE METABOLIC PANEL
ALT: 58 IU/L — ABNORMAL HIGH (ref 0–32)
AST: 33 IU/L (ref 0–40)
Albumin/Globulin Ratio: 1.5 (ref 1.1–2.5)
Albumin: 4.8 g/dL (ref 3.5–5.5)
Alkaline Phosphatase: 102 IU/L (ref 39–117)
BUN/Creatinine Ratio: 27 — ABNORMAL HIGH (ref 8–20)
BUN: 17 mg/dL (ref 6–20)
Bilirubin Total: 0.3 mg/dL (ref 0.0–1.2)
CALCIUM: 9.8 mg/dL (ref 8.7–10.2)
CO2: 25 mmol/L (ref 18–29)
CREATININE: 0.62 mg/dL (ref 0.57–1.00)
Chloride: 102 mmol/L (ref 96–106)
GFR calc Af Amer: 145 mL/min/{1.73_m2} (ref >59)
GFR, EST NON AFRICAN AMERICAN: 126 mL/min/{1.73_m2} (ref >59)
GLOBULIN, TOTAL: 3.2 g/dL (ref 1.5–4.5)
Glucose: 50 mg/dL — ABNORMAL LOW (ref 65–99)
Potassium: 4.5 mmol/L (ref 3.5–5.2)
SODIUM: 143 mmol/L (ref 134–144)
TOTAL PROTEIN: 8 g/dL (ref 6.0–8.5)

## 2015-03-30 LAB — CBC WITH DIFFERENTIAL/PLATELET
Basophils Absolute: 0 10*3/uL (ref 0.0–0.2)
Basos: 0 %
EOS (ABSOLUTE): 0.2 10*3/uL (ref 0.0–0.4)
EOS: 4 %
HEMATOCRIT: 43.4 % (ref 34.0–46.6)
HEMOGLOBIN: 14.8 g/dL (ref 11.1–15.9)
IMMATURE GRANS (ABS): 0 10*3/uL (ref 0.0–0.1)
IMMATURE GRANULOCYTES: 0 %
LYMPHS: 21 %
Lymphocytes Absolute: 1.3 10*3/uL (ref 0.7–3.1)
MCH: 30.9 pg (ref 26.6–33.0)
MCHC: 34.1 g/dL (ref 31.5–35.7)
MCV: 91 fL (ref 79–97)
MONOCYTES: 8 %
MONOS ABS: 0.5 10*3/uL (ref 0.1–0.9)
NEUTROS PCT: 67 %
Neutrophils Absolute: 4.2 10*3/uL (ref 1.4–7.0)
Platelets: 225 10*3/uL (ref 150–379)
RBC: 4.79 x10E6/uL (ref 3.77–5.28)
RDW: 14 % (ref 12.3–15.4)
WBC: 6.3 10*3/uL (ref 3.4–10.8)

## 2015-03-30 LAB — LIPID PANEL
CHOL/HDL RATIO: 3.1 ratio (ref 0.0–4.4)
Cholesterol, Total: 233 mg/dL — ABNORMAL HIGH (ref 100–199)
HDL: 76 mg/dL (ref >39)
LDL CALC: 137 mg/dL — AB (ref 0–99)
TRIGLYCERIDES: 101 mg/dL (ref 0–149)
VLDL Cholesterol Cal: 20 mg/dL (ref 5–40)

## 2015-03-30 LAB — TSH: TSH: 0.393 u[IU]/mL — ABNORMAL LOW (ref 0.450–4.500)

## 2015-04-14 ENCOUNTER — Telehealth: Payer: Self-pay

## 2015-04-14 NOTE — Telephone Encounter (Signed)
Have been trying to call patient at home phone number and messages have been left.  No return phone calls have come from her to the office, a letter was sent to her home address it was sent back return to sender. Will see if patient keeps up coming appointment on 04-26-2015.

## 2015-04-26 ENCOUNTER — Encounter: Payer: 59 | Admitting: Nurse Practitioner

## 2015-04-26 ENCOUNTER — Encounter: Payer: Self-pay | Admitting: Family Medicine

## 2015-04-26 ENCOUNTER — Encounter: Payer: Self-pay | Admitting: Nurse Practitioner

## 2015-06-02 ENCOUNTER — Encounter: Payer: Self-pay | Admitting: Nurse Practitioner

## 2016-02-07 ENCOUNTER — Emergency Department (HOSPITAL_COMMUNITY)
Admission: EM | Admit: 2016-02-07 | Discharge: 2016-02-08 | Disposition: A | Discharge status: Home / Self Care | Payer: Self-pay | Attending: Dermatology | Admitting: Dermatology

## 2016-02-07 ENCOUNTER — Encounter (HOSPITAL_COMMUNITY): Payer: Self-pay

## 2016-02-07 DIAGNOSIS — Z5321 Procedure and treatment not carried out due to patient leaving prior to being seen by health care provider: Secondary | ICD-10-CM | POA: Insufficient documentation

## 2016-02-07 DIAGNOSIS — T7840XA Allergy, unspecified, initial encounter: Secondary | ICD-10-CM | POA: Insufficient documentation

## 2016-02-07 NOTE — ED Notes (Signed)
Pt called for again x3 for vital sign reassessment. No answer no response.

## 2016-02-07 NOTE — ED Triage Notes (Addendum)
Pt reports an allergic rxn to an unknown source. She reports she was stung by a bee around 1200 and she ate pineapple and began to have hives. No hx of allergic rxn to pineapple. Some redness noted to hands. Pt initially reported hives. No SOB, lung sounds clear. Pt does reports she feels like her throat feels tight. No oral swelling noted. Pt reports she took 50mg  of benadryl PTA.

## 2016-02-07 NOTE — ED Notes (Addendum)
Pt called for in waiting area for vital sign reassessment. No answer. 

## 2016-02-07 NOTE — ED Notes (Signed)
Pt called x5 and still no answer.

## 2016-03-19 ENCOUNTER — Encounter (HOSPITAL_COMMUNITY): Payer: Self-pay

## 2016-03-19 ENCOUNTER — Emergency Department (HOSPITAL_COMMUNITY): Payer: Self-pay

## 2016-03-19 ENCOUNTER — Emergency Department (HOSPITAL_COMMUNITY)
Admission: EM | Admit: 2016-03-19 | Discharge: 2016-03-20 | Disposition: A | Discharge status: Home / Self Care | Payer: Self-pay | Attending: Emergency Medicine | Admitting: Emergency Medicine

## 2016-03-19 DIAGNOSIS — L03211 Cellulitis of face: Secondary | ICD-10-CM | POA: Insufficient documentation

## 2016-03-19 DIAGNOSIS — F17210 Nicotine dependence, cigarettes, uncomplicated: Secondary | ICD-10-CM | POA: Insufficient documentation

## 2016-03-19 LAB — I-STAT BETA HCG BLOOD, ED (MC, WL, AP ONLY): I-stat hCG, quantitative: <5 m[IU]/mL (ref <5)

## 2016-03-19 LAB — POC URINE PREG, ED: PREG TEST UR: NEGATIVE

## 2016-03-19 MED ORDER — CLINDAMYCIN HCL 300 MG PO CAPS: 300.0000 mg | ORAL_CAPSULE | Freq: Four times a day (QID) | ORAL | 0 refills | Status: AC

## 2016-03-19 MED ORDER — IOPAMIDOL (ISOVUE-300) INJECTION 61%
INTRAVENOUS | Status: AC
  Administered 2016-03-19: 23:00:00
  Filled 2016-03-19: qty 75

## 2016-03-19 MED ORDER — VANCOMYCIN HCL IN DEXTROSE 1-5 GM/200ML-% IV SOLN
1000.0000 mg | Freq: Once | INTRAVENOUS | Status: AC
  Administered 2016-03-20: 1000 mg via INTRAVENOUS
  Filled 2016-03-19: qty 200

## 2016-03-19 MED ORDER — FENTANYL CITRATE (PF) 100 MCG/2ML IJ SOLN
50.0000 ug | Freq: Once | INTRAMUSCULAR | Status: AC
  Administered 2016-03-19: 50 ug via INTRAVENOUS
  Filled 2016-03-19: qty 2

## 2016-03-19 NOTE — ED Provider Notes (Signed)
Patient presents with acute left submandibular anterior neck swelling. States that she had a pimple in the area yesterday which she tried to open. Left submandibular mass which is tender to palpation. Mild overlying erythema. No difficulty breathing. Will get CT soft tissue neck to determine the extent of suspected abscess.   Loren Raceravid Anabela Crayton, MD 03/19/16 [phone]

## 2016-03-19 NOTE — ED Notes (Signed)
Patient transported to CT 

## 2016-03-19 NOTE — ED Notes (Addendum)
Rounded on patient, wanting update about wait. Apologized for delay.

## 2016-03-19 NOTE — ED Triage Notes (Signed)
Per Pt, Pt reports having a pimple on the left side of her face that she attempted to drain. Pt reports some swelling last night, but when she woke up this morning, neck was swollen. Drainage noted from original head site with some discoloration. Pt reports pain with swallowing.

## 2016-03-19 NOTE — ED Provider Notes (Signed)
[facility]-EMERGENCY DEPT Provider Note   CSN: [id number] Arrival date & time: 03/19/16  1600     History   Chief Complaint Chief Complaint  Patient presents with  . Abscess    HPI Amy Mclaughlin is a [age] y.o. female.  Patient is a 25 year old female with a history of anxiety and depression. She states that she noted a small pimple to her left neck yesterday and picked at it. This morning it was much more swollen. She states it hurts when she swallows. She denies any known fevers. She denies any prior abscesses or known history of MRSA. She denies any dental pain.      History reviewed. No pertinent past medical history.  Patient Active Problem List   Diagnosis Date Noted  . Anxiety and depression 03/29/2015  . Hyperlipidemia 03/29/2015    Past Surgical History:  Procedure Laterality Date  . CHOLECYSTECTOMY      OB History    No data available       Home Medications    Prior to Admission medications   Medication Sig Start Date End Date Taking? Authorizing Provider  ibuprofen (ADVIL,MOTRIN) 200 MG tablet Take 800 mg by mouth every 6 (six) hours as needed for headache.   Yes Historical Provider, MD  clindamycin (CLEOCIN) 300 MG capsule Take 1 capsule (300 mg total) by mouth 4 (four) times daily. X 10 days 03/19/16   Rolan BuccoMelanie Zebadiah Willert, MD    Family History No family history on file.  Social History Social History  Substance Use Topics  . Smoking status: Current Every Day Smoker    Packs/day: 0.50    Years: 10.00    Types: Cigarettes  . Smokeless tobacco: Never Used  . Alcohol use 0.0 oz/week     Comment: Occasionally      Allergies   Codeine and Sulfa antibiotics   Review of Systems Review of Systems  Constitutional: Negative for chills, diaphoresis, fatigue and fever.  HENT: Negative for congestion, rhinorrhea and sneezing.   Eyes: Negative.   Respiratory: Negative for cough, chest tightness and shortness of breath.   Cardiovascular: Negative for  chest pain and leg swelling.  Gastrointestinal: Negative for abdominal pain, blood in stool, diarrhea, nausea and vomiting.  Genitourinary: Negative for difficulty urinating, flank pain, frequency and hematuria.  Musculoskeletal: Negative for arthralgias and back pain.  Skin: Positive for wound. Negative for rash.  Neurological: Negative for dizziness, speech difficulty, weakness, numbness and headaches.     Physical Exam Updated Vital Signs BP 113/62   Pulse 93   Temp 98.8 F (37.1 C) (Oral)   Resp 17   Ht 5\' 6"  (1.676 m)   Wt 165 lb (74.8 kg)   SpO2 96%   BMI 26.63 kg/m   Physical Exam  Constitutional: She is oriented to person, place, and time. She appears well-developed and well-nourished.  HENT:  Head: Normocephalic and atraumatic.  No trouble handling secretions. No trismus. No elevation of the tongue.  Eyes: Pupils are equal, round, and reactive to light.  Neck: Normal range of motion. Neck supple.  Patient has a 2 cm indurated area to the left submandibular space. There is a small amount of surrounding erythema. There some crusting over the indurated area but no definite fluctuance.  Cardiovascular: Normal rate, regular rhythm and normal heart sounds.   Pulmonary/Chest: Effort normal and breath sounds normal. No respiratory distress. She has no wheezes. She has no rales. She exhibits no tenderness.  Abdominal: Soft. Bowel sounds are  normal. There is no tenderness. There is no rebound and no guarding.  Musculoskeletal: Normal range of motion. She exhibits no edema.  Lymphadenopathy:    She has no cervical adenopathy.  Neurological: She is alert and oriented to person, place, and time.  Skin: Skin is warm and dry. No rash noted.  Psychiatric: She has a normal mood and affect.     ED Treatments / Results  Labs (all labs ordered are listed, but only abnormal results are displayed) Labs Reviewed  I-STAT BETA HCG BLOOD, ED ([facility], [facility], [facility] ONLY)  POC URINE PREG, ED     EKG  EKG Interpretation None       Radiology Ct Soft Tissue Neck W Contrast  Result Date: 03/19/2016 CLINICAL DATA:  LEFT neck swelling at site of pimple since last night. EXAM: CT NECK WITH CONTRAST TECHNIQUE: Multidetector CT imaging of the neck was performed using the standard protocol following the bolus administration of intravenous contrast. CONTRAST:  75 cc ISOVUE-300 IOPAMIDOL (ISOVUE-300) INJECTION 61% COMPARISON:  None. FINDINGS: Pharynx and larynx: Normal. Salivary glands: Normal. Thyroid: Subcentimeter hypodensities RIGHT thyroid lobe, no indicated follow-up. Lymph nodes: 10 mm short access LEFT level IIa lymph node is likely reactive without lymphadenopathy by CT size criteria. Vascular: LEFT vertebral artery arises directly from the aortic arch, normal variant. Limited intracranial: Normal. Visualized orbits: Normal. Mastoids and visualized paranasal sinuses: Mild LEFT maxillary sinus mucosal thickening, likely odontogenic, see below. Mastoid air cells are well aerated. Skeleton: Multiple LEFT maxillary periapical lucency/ abscess extending into the floor of the antrum. Scattered dental caries. Calcified stylohyoid ligaments. Upper chest: Lung apices are clear. No superior mediastinal lymphadenopathy. Other: LEFT lower facial, upper neck and submandibular soft tissue swelling, subcutaneous fat stranding with thickened platysma and small effusion. No drainable fluid collection, subcutaneous gas or radiopaque foreign bodies. IMPRESSION: LEFT lower facial and upper neck cellulitis without abscess. Poor dentition, LEFT maxillary periapical abscess, however LEFT lower facial changes are likely not odontogenic given location. Electronically Signed   By: Awilda Metroourtnay  Bloomer M.D.   On: 03/19/2016 23:09    Procedures Procedures (including critical care time)  Medications Ordered in ED Medications  vancomycin (VANCOCIN) IVPB 1000 mg/200 mL premix (not administered)  fentaNYL (SUBLIMAZE)  injection 50 mcg (50 mcg Intravenous Given 03/19/16 2216)  iopamidol (ISOVUE-300) 61 % injection (  Contrast Given 03/19/16 2239)     Initial Impression / Assessment and Plan / ED Course  I have reviewed the triage vital signs and the nursing notes.  Pertinent labs & imaging results that were available during my care of the patient were reviewed by me and considered in my medical decision making (see chart for details).  Clinical Course     Patient presents with a facial cellulitis. There is no evidence of abscess on CT. There is no airway involvement. No difficulty controlling secretions. No fever or suggestions of systemic infection. She was given a dose of vancomycin initially discharged on clindamycin. I encouraged her to have a recheck in 24 hours or return sooner if her swelling worsens.  Final Clinical Impressions(s) / ED Diagnoses   Final diagnoses:  Cellulitis of face    New Prescriptions New Prescriptions   CLINDAMYCIN (CLEOCIN) 300 MG CAPSULE    Take 1 capsule (300 mg total) by mouth 4 (four) times daily. X 10 days     Rolan BuccoMelanie Shailynn Fong, MD 03/19/16 2352

## 2016-03-19 NOTE — ED Triage Notes (Signed)
Called times 2 for triage no answer

## 2016-03-20 MED ORDER — CLINDAMYCIN HCL 150 MG PO CAPS
300.0000 mg | ORAL_CAPSULE | Freq: Once | ORAL | Status: AC
  Administered 2016-03-20: 300 mg via ORAL
  Filled 2016-03-20: qty 2

## 2016-03-20 MED ORDER — FENTANYL CITRATE (PF) 100 MCG/2ML IJ SOLN
50.0000 ug | Freq: Once | INTRAMUSCULAR | Status: AC
  Administered 2016-03-20: 50 ug via INTRAVENOUS
  Filled 2016-03-20: qty 2

## 2016-03-20 MED ORDER — HYDROCODONE-ACETAMINOPHEN 5-325 MG PO TABS: 1.0000 | ORAL_TABLET | ORAL | 0 refills | Status: AC | PRN

## 2016-03-20 NOTE — ED Provider Notes (Signed)
Pt had reaction to vancomycin (hives on arm) No SOB reported She is now improved She received 3/4 of the vanc This was stopped Oral clindamycin ordered We discussed return precaution vicodin ordered Narcotic database reviewed and considered in decision making    Amy Mclaughlin And[place]on, MD 03/20/16 [phone]

## 2018-01-27 IMAGING — CT CT NECK W/ CM
3 of 5 series · 12 of 33 positions shown, 14 images · IV contrast (Omni 300)
Comparison: None.

CLINICAL DATA: LEFT neck swelling at site of pimple since last
night.

EXAM:
CT NECK WITH CONTRAST
TECHNIQUE: Multidetector CT imaging of the neck was performed using the
standard protocol following the bolus administration of intravenous
contrast.
CONTRAST:  75 cc [id number] IOPAMIDOL ([id number]) INJECTION 61%

[Series 4: neck 2.0 st · sagittal · 0.42mm/px · 5 of 83 slices shown, 6 images (1 of 2)]
[im 28/83  bone]
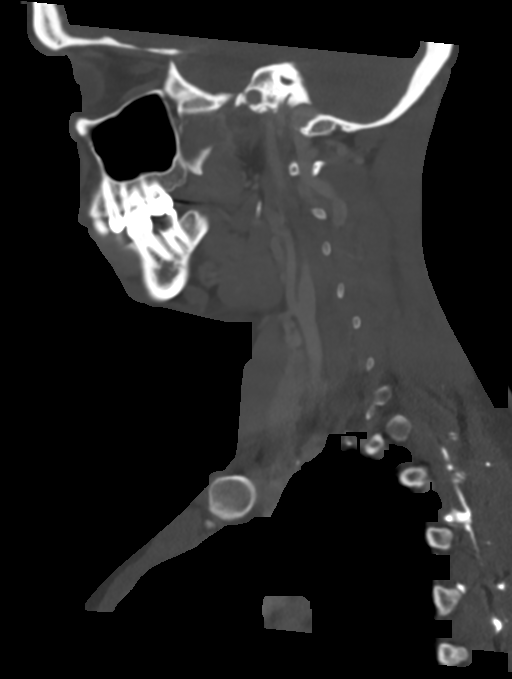
[im 35/83  bone]
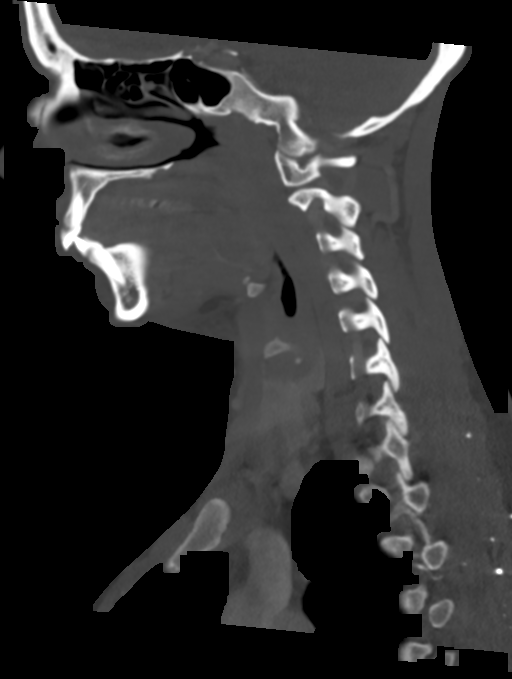
[im 42/83  soft-tissue]
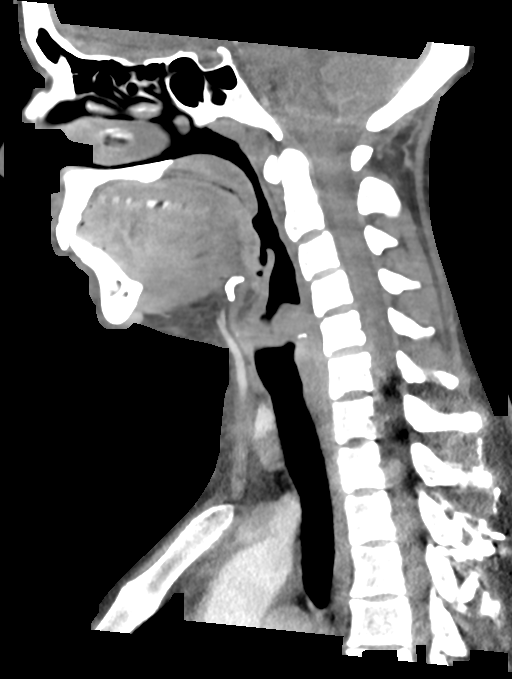
[im 42/83  bone]
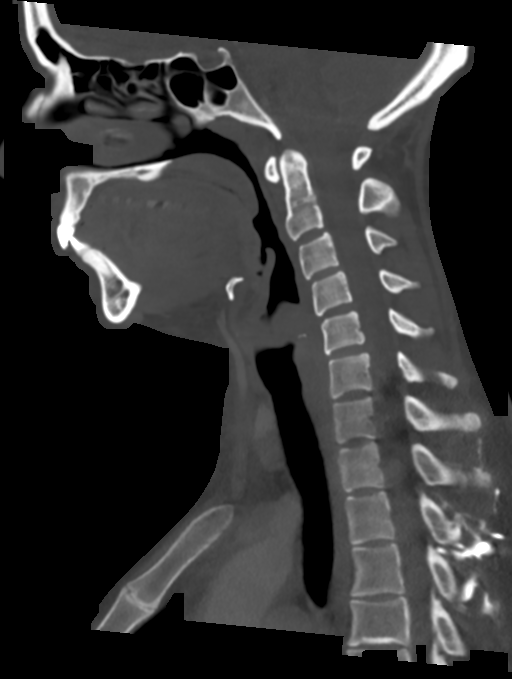
[im 48/83  bone]
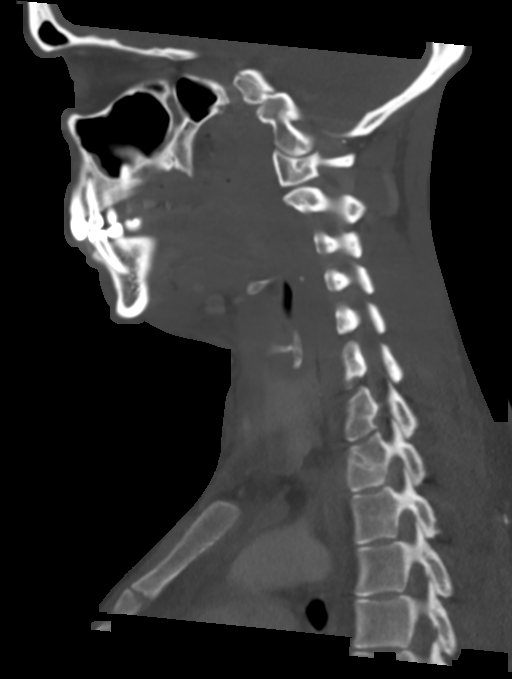
[im 55/83  bone]
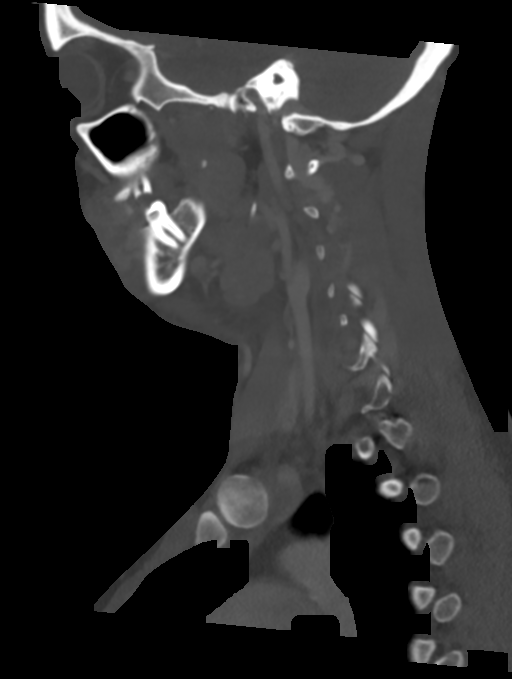

[Series 5: neck 2.0 st · coronal · 0.32mm/px · 3 of 108 slices shown (2 of 2)]
[im 22/108  bone]
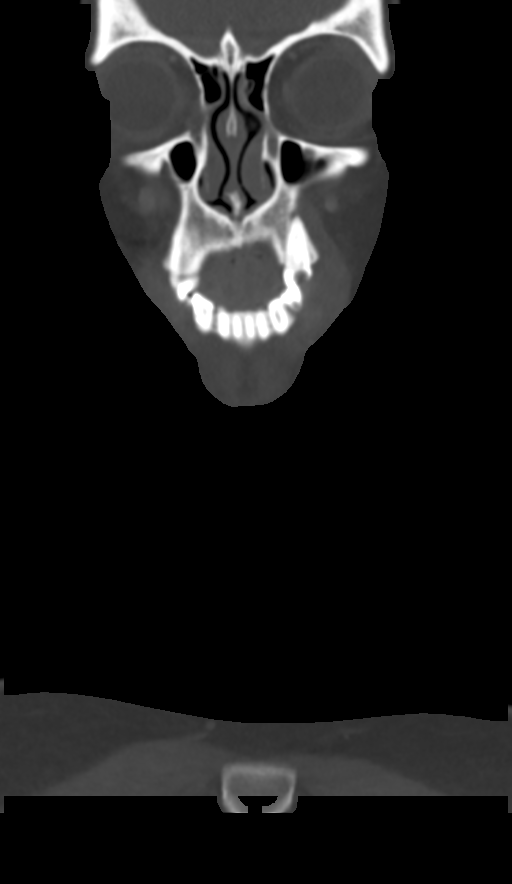
[im 43/108  bone]
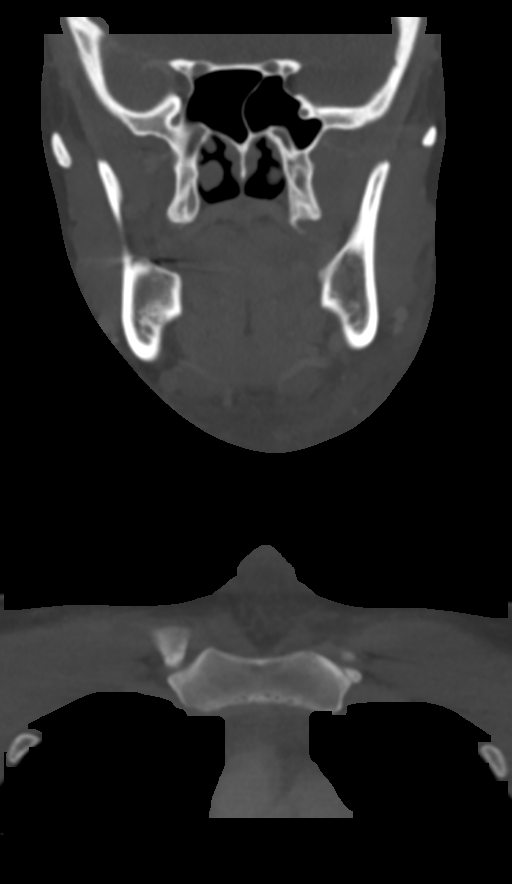
[im 65/108  bone]
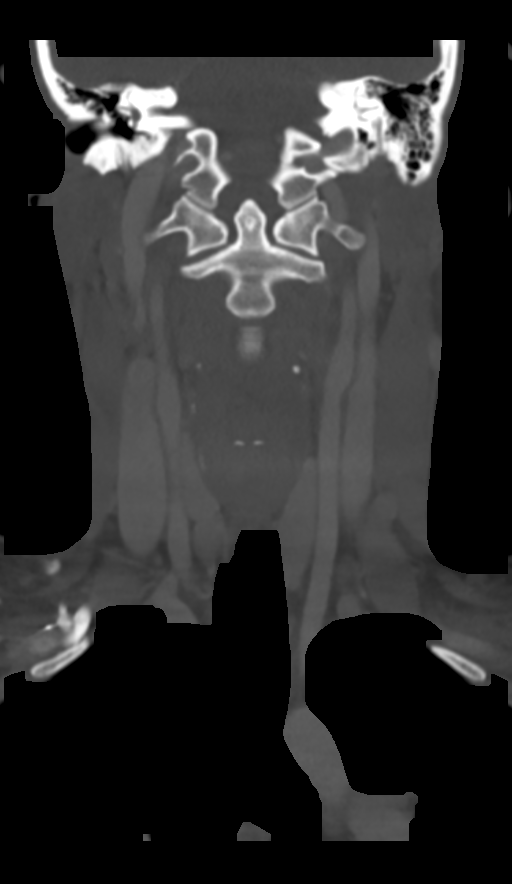

[Series 6: neck 2.0 st orthogonal · axial · 0.37mm/px · z∈[-246,-82]mm · 4 of 139 slices shown, 5 images]
[im 28/139  soft-tissue]
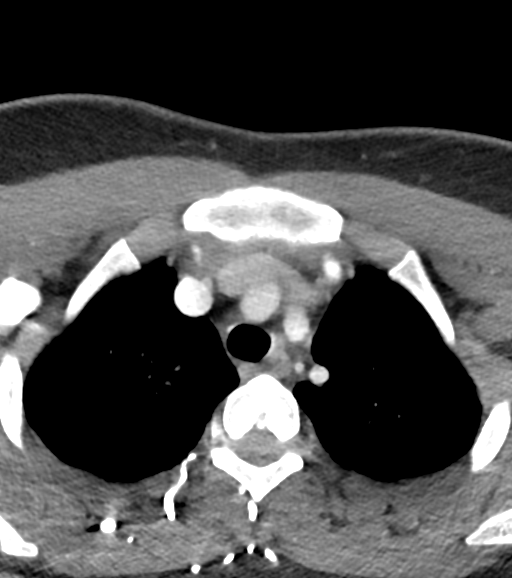
[im 28/139  bone]
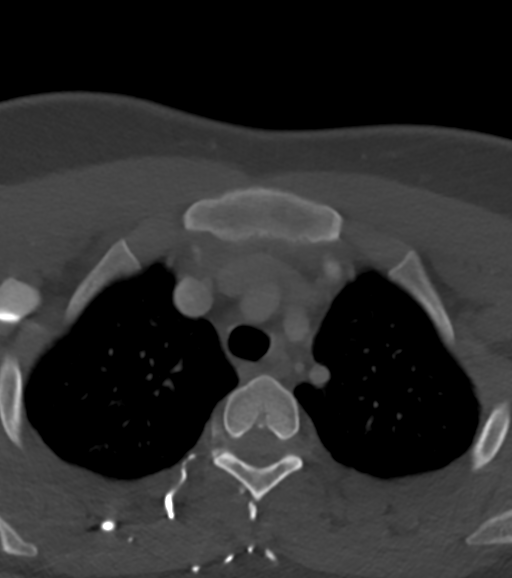
[im 56/139  bone]
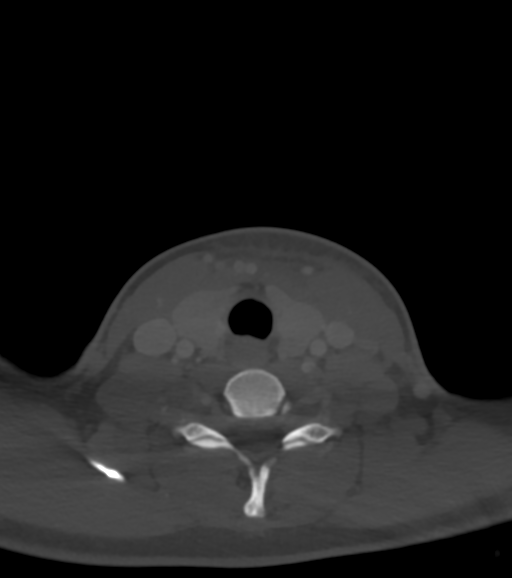
[im 83/139  bone]
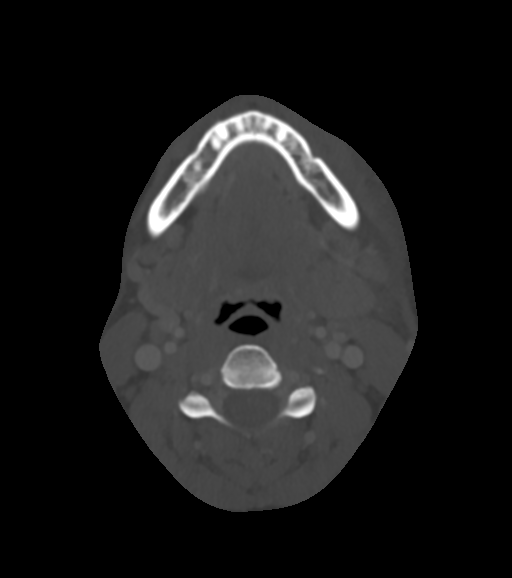
[im 111/139  bone]
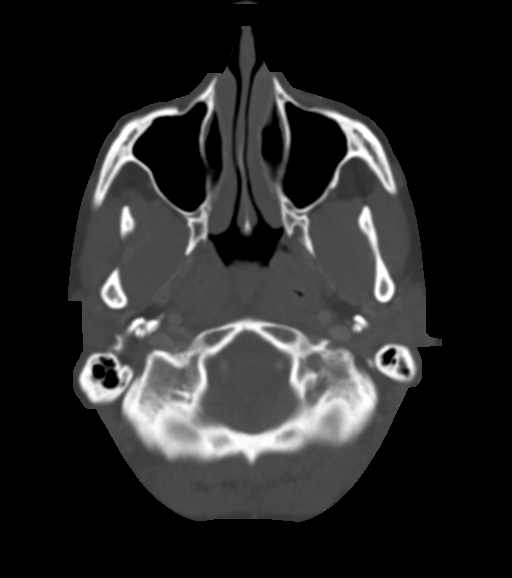

[12 of 33 positions shown; findings below may reference images not displayed]

FINDINGS: Pharynx and larynx: Normal.

Salivary glands: Normal.

Thyroid: Subcentimeter hypodensities RIGHT thyroid lobe, no
indicated follow-up.

Lymph nodes: 10 mm short access LEFT level IIa lymph node is likely
reactive without lymphadenopathy by CT size criteria.

Vascular: LEFT vertebral artery arises directly from the aortic
arch, normal variant.

Limited intracranial: Normal.

Visualized orbits: Normal.

Mastoids and visualized paranasal sinuses: Mild LEFT maxillary sinus
mucosal thickening, likely odontogenic, see below. Mastoid air cells
are well aerated.

Skeleton: Multiple LEFT maxillary periapical lucency/ abscess
extending into the floor of the antrum. Scattered dental caries.
Calcified stylohyoid ligaments.

Upper chest: Lung apices are clear. No superior mediastinal
lymphadenopathy.

Other: LEFT lower facial, upper neck and submandibular soft tissue
swelling, subcutaneous fat stranding with thickened platysma and
small effusion. No drainable fluid collection, subcutaneous gas or
radiopaque foreign bodies.
IMPRESSION: LEFT lower facial and upper neck cellulitis without abscess.

Poor dentition, LEFT maxillary periapical abscess, however LEFT
lower facial changes are likely not odontogenic given location.
